# Patient Record
Sex: Female | Born: 1970 | Race: Black or African American | Hispanic: No | Marital: Single | Smoking: Never smoker
Health system: Southern US, Community
[De-identification: ages and names within clinical notes are randomized; demographics above are authoritative.]

## PROBLEM LIST (undated history)

## (undated) ENCOUNTER — Emergency Department (HOSPITAL_COMMUNITY): Admission: EM | Payer: No Typology Code available for payment source | Source: Home / Self Care

## (undated) DIAGNOSIS — F32A Depression, unspecified: Secondary | ICD-10-CM

## (undated) DIAGNOSIS — E785 Hyperlipidemia, unspecified: Secondary | ICD-10-CM

## (undated) DIAGNOSIS — B009 Herpesviral infection, unspecified: Secondary | ICD-10-CM

## (undated) DIAGNOSIS — K429 Umbilical hernia without obstruction or gangrene: Secondary | ICD-10-CM

## (undated) DIAGNOSIS — I8393 Asymptomatic varicose veins of bilateral lower extremities: Secondary | ICD-10-CM

## (undated) DIAGNOSIS — F329 Major depressive disorder, single episode, unspecified: Secondary | ICD-10-CM

## (undated) HISTORY — DX: Asymptomatic varicose veins of bilateral lower extremities: I83.93

## (undated) HISTORY — PX: TUBAL LIGATION: SHX77

## (undated) HISTORY — DX: Major depressive disorder, single episode, unspecified: F32.9

## (undated) HISTORY — DX: Hyperlipidemia, unspecified: E78.5

## (undated) HISTORY — DX: Depression, unspecified: F32.A

---

## 1998-07-04 ENCOUNTER — Other Ambulatory Visit: Admission: RE | Admit: 1998-07-04 | Discharge: 1998-07-04 | Payer: Self-pay | Admitting: Obstetrics

## 1998-07-05 ENCOUNTER — Ambulatory Visit (HOSPITAL_COMMUNITY): Admission: RE | Admit: 1998-07-05 | Discharge: 1998-07-05 | Payer: Self-pay | Admitting: Obstetrics

## 1998-07-08 ENCOUNTER — Encounter: Payer: Self-pay | Admitting: Obstetrics

## 1998-07-08 ENCOUNTER — Ambulatory Visit (HOSPITAL_COMMUNITY): Admission: RE | Admit: 1998-07-08 | Discharge: 1998-07-08 | Payer: Self-pay | Admitting: Obstetrics

## 1998-07-17 ENCOUNTER — Inpatient Hospital Stay (HOSPITAL_COMMUNITY): Admission: RE | Admit: 1998-07-17 | Discharge: 1998-07-17 | Payer: Self-pay | Admitting: Obstetrics

## 1998-07-17 ENCOUNTER — Encounter: Payer: Self-pay | Admitting: Obstetrics

## 1999-11-25 ENCOUNTER — Other Ambulatory Visit: Admission: RE | Admit: 1999-11-25 | Discharge: 1999-11-25 | Payer: Self-pay | Admitting: Family Medicine

## 2000-08-02 ENCOUNTER — Emergency Department (HOSPITAL_COMMUNITY): Admission: EM | Admit: 2000-08-02 | Discharge: 2000-08-02 | Payer: Self-pay | Admitting: Emergency Medicine

## 2000-08-02 ENCOUNTER — Encounter: Payer: Self-pay | Admitting: Emergency Medicine

## 2000-08-03 ENCOUNTER — Emergency Department (HOSPITAL_COMMUNITY): Admission: EM | Admit: 2000-08-03 | Discharge: 2000-08-03 | Payer: Self-pay | Admitting: Emergency Medicine

## 2003-01-02 ENCOUNTER — Emergency Department (HOSPITAL_COMMUNITY): Admission: EM | Admit: 2003-01-02 | Discharge: 2003-01-02 | Payer: Self-pay | Admitting: Emergency Medicine

## 2003-02-03 ENCOUNTER — Emergency Department (HOSPITAL_COMMUNITY): Admission: EM | Admit: 2003-02-03 | Discharge: 2003-02-03 | Payer: Self-pay | Admitting: Emergency Medicine

## 2003-07-11 ENCOUNTER — Emergency Department (HOSPITAL_COMMUNITY): Admission: EM | Admit: 2003-07-11 | Discharge: 2003-07-11 | Payer: Self-pay | Admitting: Emergency Medicine

## 2003-12-18 ENCOUNTER — Ambulatory Visit (HOSPITAL_COMMUNITY): Admission: RE | Admit: 2003-12-18 | Discharge: 2003-12-18 | Payer: Self-pay | Admitting: Obstetrics

## 2004-09-05 ENCOUNTER — Inpatient Hospital Stay (HOSPITAL_COMMUNITY): Admission: AD | Admit: 2004-09-05 | Discharge: 2004-09-05 | Payer: Self-pay | Admitting: Family Medicine

## 2004-12-10 ENCOUNTER — Inpatient Hospital Stay (HOSPITAL_COMMUNITY): Admission: AD | Admit: 2004-12-10 | Discharge: 2004-12-10 | Payer: Self-pay | Admitting: Obstetrics

## 2005-10-31 ENCOUNTER — Emergency Department (HOSPITAL_COMMUNITY): Admission: AD | Admit: 2005-10-31 | Discharge: 2005-10-31 | Payer: Self-pay | Admitting: Family Medicine

## 2006-11-10 ENCOUNTER — Emergency Department (HOSPITAL_COMMUNITY): Admission: EM | Admit: 2006-11-10 | Discharge: 2006-11-10 | Payer: Self-pay | Admitting: Emergency Medicine

## 2006-12-24 ENCOUNTER — Emergency Department (HOSPITAL_COMMUNITY): Admission: EM | Admit: 2006-12-24 | Discharge: 2006-12-24 | Payer: Self-pay | Admitting: Emergency Medicine

## 2007-12-05 ENCOUNTER — Ambulatory Visit: Payer: Self-pay | Admitting: Nurse Practitioner

## 2007-12-05 DIAGNOSIS — E669 Obesity, unspecified: Secondary | ICD-10-CM | POA: Insufficient documentation

## 2007-12-05 DIAGNOSIS — K429 Umbilical hernia without obstruction or gangrene: Secondary | ICD-10-CM | POA: Insufficient documentation

## 2007-12-05 DIAGNOSIS — B351 Tinea unguium: Secondary | ICD-10-CM | POA: Insufficient documentation

## 2007-12-28 ENCOUNTER — Ambulatory Visit: Payer: Self-pay | Admitting: Nurse Practitioner

## 2007-12-28 DIAGNOSIS — A5901 Trichomonal vulvovaginitis: Secondary | ICD-10-CM

## 2007-12-28 LAB — CONVERTED CEMR LAB
ALT: 12 units/L (ref 0–35)
AST: 13 units/L (ref 0–37)
Albumin: 3.9 g/dL (ref 3.5–5.2)
Alkaline Phosphatase: 50 units/L (ref 39–117)
BUN: 11 mg/dL (ref 6–23)
Bilirubin Urine: NEGATIVE
Blood in Urine, dipstick: NEGATIVE
CO2: 24 meq/L (ref 19–32)
Eosinophils Absolute: 0.3 10*3/uL (ref 0.0–0.7)
Eosinophils Relative: 5 % (ref 0–5)
Glucose, Bld: 98 mg/dL (ref 70–99)
Glucose, Urine, Semiquant: NEGATIVE
HDL: 40 mg/dL (ref >39)
Hemoglobin: 12.2 g/dL (ref 12.0–15.0)
LDL Cholesterol: 132 mg/dL — ABNORMAL HIGH (ref 0–99)
Lymphocytes Relative: 52 % — ABNORMAL HIGH (ref 12–46)
Lymphs Abs: 2.8 10*3/uL (ref 0.7–4.0)
Monocytes Absolute: 0.5 10*3/uL (ref 0.1–1.0)
Monocytes Relative: 8 % (ref 3–12)
Neutro Abs: 1.9 10*3/uL (ref 1.7–7.7)
Nitrite: NEGATIVE
Platelets: 260 10*3/uL (ref 150–400)
RDW: 14.6 % (ref 11.5–15.5)
Sodium: 141 meq/L (ref 135–145)
Specific Gravity, Urine: >=1.03
TSH: 0.897 microintl units/mL (ref 0.350–4.50)
Total Bilirubin: 0.6 mg/dL (ref 0.3–1.2)
Total Protein: 7.5 g/dL (ref 6.0–8.3)
Urobilinogen, UA: 0.2
pH: 5

## 2007-12-30 ENCOUNTER — Encounter (INDEPENDENT_AMBULATORY_CARE_PROVIDER_SITE_OTHER): Payer: Self-pay | Admitting: Nurse Practitioner

## 2007-12-30 LAB — CONVERTED CEMR LAB: Pap Smear: NEGATIVE

## 2008-01-03 ENCOUNTER — Ambulatory Visit (HOSPITAL_COMMUNITY): Admission: RE | Admit: 2008-01-03 | Discharge: 2008-01-03 | Payer: Self-pay | Admitting: Family Medicine

## 2008-01-12 ENCOUNTER — Emergency Department (HOSPITAL_COMMUNITY): Admission: EM | Admit: 2008-01-12 | Discharge: 2008-01-12 | Payer: Self-pay | Admitting: Emergency Medicine

## 2008-01-13 ENCOUNTER — Telehealth (INDEPENDENT_AMBULATORY_CARE_PROVIDER_SITE_OTHER): Payer: Self-pay | Admitting: Nurse Practitioner

## 2008-12-26 ENCOUNTER — Emergency Department (HOSPITAL_COMMUNITY): Admission: EM | Admit: 2008-12-26 | Discharge: 2008-12-27 | Payer: Self-pay | Admitting: Emergency Medicine

## 2009-04-01 ENCOUNTER — Emergency Department (HOSPITAL_COMMUNITY): Admission: EM | Admit: 2009-04-01 | Discharge: 2009-04-01 | Payer: Self-pay | Admitting: Emergency Medicine

## 2009-06-06 ENCOUNTER — Ambulatory Visit: Payer: Self-pay | Admitting: Nurse Practitioner

## 2009-06-06 DIAGNOSIS — M79609 Pain in unspecified limb: Secondary | ICD-10-CM | POA: Insufficient documentation

## 2009-06-06 LAB — CONVERTED CEMR LAB
BUN: 10 mg/dL (ref 6–23)
Basophils Relative: 1 % (ref 0–1)
CO2: 26 meq/L (ref 19–32)
Cholesterol: 209 mg/dL — ABNORMAL HIGH (ref 0–200)
Creatinine, Ser: 0.58 mg/dL (ref 0.40–1.20)
Eosinophils Absolute: 0.2 10*3/uL (ref 0.0–0.7)
Glucose, Bld: 94 mg/dL (ref 70–99)
Hemoglobin: 12.4 g/dL (ref 12.0–15.0)
LDL Cholesterol: 142 mg/dL — ABNORMAL HIGH (ref 0–99)
Lymphocytes Relative: 47 % — ABNORMAL HIGH (ref 12–46)
MCHC: 31.7 g/dL (ref 30.0–36.0)
Monocytes Absolute: 0.4 10*3/uL (ref 0.1–1.0)
Monocytes Relative: 8 % (ref 3–12)
Neutro Abs: 2.2 10*3/uL (ref 1.7–7.7)
Neutrophils Relative %: 41 % — ABNORMAL LOW (ref 43–77)
Potassium: 4.6 meq/L (ref 3.5–5.3)
RBC: 4.35 M/uL (ref 3.87–5.11)
Sodium: 140 meq/L (ref 135–145)
TSH: 0.88 microintl units/mL (ref 0.350–4.500)
Total Bilirubin: 0.4 mg/dL (ref 0.3–1.2)
Total CHOL/HDL Ratio: 4.2
Total Protein: 7.4 g/dL (ref 6.0–8.3)

## 2009-06-07 ENCOUNTER — Encounter (INDEPENDENT_AMBULATORY_CARE_PROVIDER_SITE_OTHER): Payer: Self-pay | Admitting: Nurse Practitioner

## 2009-06-19 ENCOUNTER — Ambulatory Visit: Payer: Self-pay | Admitting: Nurse Practitioner

## 2009-06-19 DIAGNOSIS — E7800 Pure hypercholesterolemia, unspecified: Secondary | ICD-10-CM | POA: Insufficient documentation

## 2009-06-19 DIAGNOSIS — I8390 Asymptomatic varicose veins of unspecified lower extremity: Secondary | ICD-10-CM

## 2009-06-19 DIAGNOSIS — R209 Unspecified disturbances of skin sensation: Secondary | ICD-10-CM | POA: Insufficient documentation

## 2009-06-19 LAB — CONVERTED CEMR LAB
Bilirubin Urine: NEGATIVE
Blood in Urine, dipstick: NEGATIVE
Cholesterol, target level: 200 mg/dL
Glucose, Urine, Semiquant: NEGATIVE
HDL goal, serum: 40 mg/dL
Ketones, urine, test strip: NEGATIVE
LDL Goal: 160 mg/dL
Protein, U semiquant: NEGATIVE
Urobilinogen, UA: 0.2
pH: 5.5

## 2009-06-20 DIAGNOSIS — B009 Herpesviral infection, unspecified: Secondary | ICD-10-CM | POA: Insufficient documentation

## 2009-06-20 LAB — CONVERTED CEMR LAB: Chlamydia, DNA Probe: NEGATIVE

## 2009-06-26 ENCOUNTER — Encounter (INDEPENDENT_AMBULATORY_CARE_PROVIDER_SITE_OTHER): Payer: Self-pay | Admitting: Nurse Practitioner

## 2009-06-26 LAB — CONVERTED CEMR LAB: Pap Smear: NEGATIVE

## 2009-07-05 ENCOUNTER — Telehealth (INDEPENDENT_AMBULATORY_CARE_PROVIDER_SITE_OTHER): Payer: Self-pay | Admitting: Nurse Practitioner

## 2010-01-14 ENCOUNTER — Ambulatory Visit: Payer: Self-pay | Admitting: Nurse Practitioner

## 2010-01-14 DIAGNOSIS — G8929 Other chronic pain: Secondary | ICD-10-CM | POA: Insufficient documentation

## 2010-01-14 DIAGNOSIS — F329 Major depressive disorder, single episode, unspecified: Secondary | ICD-10-CM | POA: Insufficient documentation

## 2010-01-14 DIAGNOSIS — M549 Dorsalgia, unspecified: Secondary | ICD-10-CM | POA: Insufficient documentation

## 2010-03-03 ENCOUNTER — Ambulatory Visit: Payer: Self-pay | Admitting: Nurse Practitioner

## 2010-04-11 ENCOUNTER — Ambulatory Visit (HOSPITAL_COMMUNITY)
Admission: RE | Admit: 2010-04-11 | Discharge: 2010-04-11 | Payer: Self-pay | Source: Home / Self Care | Admitting: Internal Medicine

## 2010-05-15 ENCOUNTER — Telehealth (INDEPENDENT_AMBULATORY_CARE_PROVIDER_SITE_OTHER): Payer: Self-pay | Admitting: Nurse Practitioner

## 2010-06-15 ENCOUNTER — Encounter: Payer: Self-pay | Admitting: Internal Medicine

## 2010-06-24 NOTE — Letter (Signed)
Summary: Handout Printed  Printed Handout:  - Varicose Veins 

## 2010-06-24 NOTE — Progress Notes (Signed)
Summary: DEPRESSION SCREENING  DEPRESSION SCREENING   Imported By: Arta Bruce 07/15/2009 15:59:52  _____________________________________________________________________  External Attachment:    Type:   Image     Comment:   External Document

## 2010-06-24 NOTE — Letter (Signed)
Summary: *HSN Results Follow up  [facility]  [address] [place], [place] [address]   Phone: [phone]  Fax: [phone]      06/26/2009   [name] [address] [place], [place]  [address]   Dear  Ms. Mary Heath,                            ____S.Drinkard,FNP   ____D. Gore,FNP       ____B. McPherson,MD   ____V. Rankins,MD    ____E. Mulberry,MD    _X___[name], FNP  ____D. Reche Dixon, MD    ____K. Philipp Deputy, MD    ____Other     This letter is to inform you that your recent test(s):  ___X____Pap Smear    _______Lab Test     _______X-ray    ___X____ is within acceptable limits  _______ requires a medication change  _______ requires a follow-up lab visit  _______ requires a follow-up visit with your provider   Comments: Pap Smear results are normal.       _________________________________________________________ If you have any questions, please contact our office [phone].                    Sincerely,    [name] FNP [facility]

## 2010-06-24 NOTE — Progress Notes (Signed)
Summary: Office Visit//DEPRESSION SCREENING  Office Visit//DEPRESSION SCREENING   Imported By: Arta Bruce 01/15/2010 12:07:01  _____________________________________________________________________  External Attachment:    Type:   Image     Comment:   External Document

## 2010-06-24 NOTE — Assessment & Plan Note (Signed)
Summary: Acute - Leg Pain   Vital Signs:  Patient profile:   40 year old female LMP:     12/10 Height:      64 inches Weight:      257.7 pounds BMI:     44.39 BSA:     2.18 Temp:     97.6 degrees F oral Pulse rate:   88 / minute Pulse rhythm:   regular Resp:     20 per minute BP sitting:   120 / 76  (left arm) Cuff size:   large  Vitals Entered By: Levon Hedger (June 06, 2009 9:46 AM) CC: pain in left leg with some numbness....pt is concerned about hernia on stomach Is Patient Diabetic? No Pain Assessment Patient in pain? no       Does patient need assistance? Functional Status Self care Ambulation Normal LMP (date): 12/10 LMP - Character: light     Enter LMP: 12/10 Last PAP Result negative   CC:  pain in left leg with some numbness....pt is concerned about hernia on stomach.  History of Present Illness:  Pt into the office after no visit in 1 year with c/o left leg pain started intermittently over 1 year ago. notices when she wakes after sleeping on that side - present this morning when she woke but admits that she slept on the couch last night. Employed at [employer] working 8-10 hours per day and stands for the majority of her shift. - Tobacco abuse  Umbilical Hernia  - noted on last visit.  No problems with hernia but just wants it checked. Obesity - lost 20 since her last visit.  Habits & Providers  Alcohol-Tobacco-Diet     Alcohol drinks/day: 0     Tobacco Status: never  Exercise-Depression-Behavior     Does Patient Exercise: no     Exercise Counseling: to improve exercise regimen     Have you felt down or hopeless? no     Have you felt little pleasure in things? no     Depression Counseling: not indicated; screening negative for depression     Drug Use: no     Seat Belt Use: 100     Sun Exposure: occasionally  Medications Prior to Update: 1)  None  Allergies (verified): No Known Drug Allergies  Social History: Does Patient Exercise:   no  Review of Systems CV:  Denies chest pain or discomfort. Resp:  Denies cough. GI:  Denies abdominal pain, nausea, and vomiting. MS:  left leg pain. Neuro:  Complains of tingling; left leg. Endo:  Denies polyuria.  Physical Exam  General:  alert.  obese Head:  normocephalic.   Lungs:  normal breath sounds.   Heart:  normal rate and regular rhythm.   Abdomen:  small umbilical hernia -reducible Msk:  up to the exam table active ROM in all extremities - no deficit to left lower Neurologic:  alert & oriented X3.   Skin:  color normal.   Psych:  Oriented X3.     Impression & Recommendations:  Problem # 1:  LEG PAIN, LEFT (ICD-729.5) most likely positional  advised pt not to lie in one position at night to avoid tingling the next morning  Problem # 2:  NEED PROPHYLACTIC VACCINATION&INOCULATION FLU (ICD-V04.81) given today  Problem # 3:  UMBILICAL HERNIA (ICD-553.1) not problematic at this time. advised pt that continued weight loss is key avoid straining  Problem # 4:  OBESITY (ICD-278.00) need to increase exercise and decrease calories weight managment  handout given  Other Orders: Flu Vaccine 75yrs + [phone]) Admin 1st Vaccine ([address]) Admin 1st Vaccine [facility]) [phone]) T-Lipid Profile [phone]) T-Comprehensive Metabolic Panel [phone]) T-CBC w/Diff [phone]) T-HIV Antibody  ([facility]) [phone]) T-TSH [phone])  Patient Instructions: 1)  Your labs will be checked today and you will be notified of the results. 2)  Leg pain - likely positional from sleeping on the left side.  You will be checked for diabetes today. 3)  Continue your efforts at weight loss.  This is very helpful for your hernia. 4)  Schedule an appointment for a complete physical exam. 5)  You will need PAP, mammogra, PHQ-9., u/a.   Influenza Vaccine    Vaccine Type: Fluvax 3+    Site: right deltoid    Mfr: Sanofi Pasteur    Dose: 0.5 ml    Route: IM    Given by: Levon Hedger    Exp. Date: 11/21/2009    Lot #: [id number]    VIS given: 12/16/06 version given June 06, 2009.  Flu Vaccine Consent Questions    Do you have a history of severe allergic reactions to this vaccine? no    Any prior history of allergic reactions to egg and/or gelatin? no    Do you have a sensitivity to the preservative Thimersol? no    Do you have a past history of Guillan-Barre Syndrome? no    Do you currently have an acute febrile illness? no    Have you ever had a severe reaction to latex? no    Vaccine information given and explained to patient? yes    Are you currently pregnant? no

## 2010-06-24 NOTE — Progress Notes (Signed)
Summary: blister in mouth/schedule appt  Phone Note Call from Patient   Summary of Call: pt is calling because she has a blister in her mouth that has been there for 2 days and wants to know if she can get Rx for this because she says she does not want it to break out on her face.  [facility] [address] Initial call taken by: Levon Hedger,  July 05, 2009 3:12 PM  Follow-up for Phone Call        Unfortunately i will need to see the blister to determine what to prescribe.   Follow-up by: [name] FNP,  July 07, 2009 10:46 PM  Additional Follow-up for Phone Call Additional follow up Details #1::        schedule pt an appt...Marland KitchenMarland KitchenMikey College CMA  July 10, 2009 4:35 PM     Additional Follow-up for Phone Call Additional follow up Details #2::    CALLED PATIENT ON 02/17 AND THE PHONE WAS BUSY ALL AFTERNOON.  I CALLED AGAIN ON THIS MORNING 02/18 AND THE LINE IS STILL BUSY. Follow-up by: Leodis Rains,  July 12, 2009 8:30 AM  Additional Follow-up for Phone Call Additional follow up Details #3:: Details for Additional Follow-up Action Taken: ATTEMPTED TO CALL HOME AGAIN AND THE NUMBER IS STILL BUSY. GOING TO SIGN OFF AND IF PATIENT CALLS, WE NEED TO SEE IF SHE STILL NEEDS AN APPOINTMENT. Additional Follow-up by: Leodis Rains,  July 12, 2009 1:18 PM

## 2010-06-24 NOTE — Assessment & Plan Note (Signed)
Summary: Complete Physical  Exam   Vital Signs:  Patient profile:   40 year old female Menstrual status:  regular LMP:     06/09/2009 Weight:      258.2 pounds BMI:     44.48 BSA:     2.18 Temp:     97.3 degrees F oral Pulse rate:   80 / minute Pulse rhythm:   regular Resp:     20 per minute BP sitting:   123 / 85  (left arm) Cuff size:   large  Vitals Entered By: Levon Hedger (June 19, 2009 10:35 AM) CC: Lipid Management Is Patient Diabetic? No Pain Assessment Patient in pain? no       Does patient need assistance? Functional Status Self care Ambulation Normal LMP (date): 06/09/2009 LMP - Character: light     Menstrual Status regular Enter LMP: 06/09/2009 Last PAP Result negative   CC:  Lipid Management.  History of Present Illness:  Pt into the office for a complete physical   Pap - last done 1year ago.  All previous normal. Menses - still every month.  S/p tubal ligation.   5 children, 4 living (1 deceased 1 year ago) No currently married  Mammogram - last mammogram over 1 year ago mother deceased with breast cancer at age 55  Optho - lost her glasses but she lost the glasses. Reading glasses.  Eyes checked by Dr. Mitzi Davenport  Dental - No recent dental exam.   Tdap - up to date, done last in 2009  Lipid Management History:      Negative NCEP/ATP III risk factors include female age less than 72 years old, non-diabetic, no family history for ischemic heart disease, non-tobacco-user status, non-hypertensive, no ASHD (atherosclerotic heart disease), no prior stroke/TIA, no peripheral vascular disease, and no history of aortic aneurysm.        Comments include: No current meds.  Comments: Elevation noted on last visit.  no current meds.Pt is employed at [employer] so finds it hard to modify her diet.   Habits & Providers  Alcohol-Tobacco-Diet     Alcohol drinks/day: 0     Tobacco Status: never  Exercise-Depression-Behavior     Does Patient  Exercise: no     Exercise Counseling: to improve exercise regimen     Have you felt down or hopeless? no     Have you felt little pleasure in things? no     Depression Counseling: not indicated; screening negative for depression     STD Risk: past     Drug Use: no     Seat Belt Use: 100     Sun Exposure: occasionally  Comments: PHQ-9 score = 12  Medications Prior to Update: 1)  None  Allergies (verified): No Known Drug Allergies  Social History: STD Risk:  past  Review of Systems General:  Denies fever. Eyes:  Denies blurring. ENT:  Denies earache. CV:  Denies chest pain or discomfort. Resp:  Denies cough. GI:  Denies abdominal pain, nausea, and vomiting. GU:  Denies dysuria. MS:  Denies joint pain. Derm:  Denies rash; nose - pimple that appeared about 2 days ago.  getting smaller in size. Neuro:  Complains of tingling; denies headaches; sometimes lower lips, sometimes sees a "bump" that lasts for about 2 days. Psych:  Denies anxiety and depression. Endo:  Denies excessive urination.  Physical Exam  General:  alert.  obese Head:  normocephalic.   Eyes:  vision grossly intact, pupils equal, and pupils round.  Ears:  Bil TM with bony landmarks present right with minimal cerumen Nose:  no nasal discharge.   Mouth:  fair dentition.  some missing teeth Neck:  supple.   Chest Wall:  no mass.   Breasts:  pendulous nipples everted, no masses or lumps noted  Lungs:  normal breath sounds.   Heart:  normal rate and regular rhythm.   Abdomen:  soft, non-tender, and normal bowel sounds.   Rectal:  no external abnormalities.  guaiac negative Msk:  up to the exam table varicosities to bil lower extremities Extremities:  no edema Neurologic:  alert & oriented X3.   Skin:  nose with papule - slightly raised and red Psych:  Oriented X3.    Pelvic Exam  Vulva:      normal appearance.   Urethra and Bladder:      Urethra--normal.   Vagina:      physiologic discharge.     Cervix:      midposition.   Adnexa:      nontender bilaterally.   Rectum:      normal, heme negative stool.      Impression & Recommendations:  Problem # 1:  ROUTINE GYNECOLOGICAL EXAMINATION (ICD-V72.31) Pap Done  labs reviewed from previous visit rec optho and dental exam  PHQ-9 score = 12 guaiac negative Orders: UA Dipstick w/o Micro (manual) (66063) T- GC Chlamydia (01601) KOH/ WET Mount [phone]) Pap Smear, Thin Prep ( Collection of) (F5732) Hemoccult Guaiac-1 spec.(in office) (82270)  Problem # 2:  UNSPECIFIED BREAST SCREENING (ICD-V76.10) mother deceased of breast cancer mammogram scheduled self breast exam reviewed Orders: Mammogram (Screening) (Mammo)  Problem # 3:  VARICOSE VEINS, LOWER EXTREMITIES (ICD-454.9) handout given pt stands for 8-10 hours per day at work will give TED hose for support  Problem # 4:  TINGLING (ICD-782.0) will check for herpes vitamin Orders: T-Herpes Simplex Type 1 (20254-27062) T-Herpes Simplex Type 2 (37628-31517)  Problem # 5:  OBESITY (ICD-278.00) advised pt that she does need to lose weight  Problem # 6:  HYPERCHOLESTEROLEMIA (ICD-272.0) dietary management  Lipid Assessment/Plan:      Based on NCEP/ATP III, the patient's risk factor category is "0-1 risk factors".  The patient's lipid goals are as follows: Total cholesterol goal is 200; LDL cholesterol goal is 160; HDL cholesterol goal is 40; Triglyceride goal is 150.    Patient Instructions: 1)  Remember to decrease the fried fatty foods in your diet.  This will help your cholesterol. 2)  You have varicose veins in your legs.  Wear support hose to work to keep them from getting worse.  Read handout 3)  nose - put warm washcloth to nose twice today.  apply ointment twice daily.  Do not use thick lotions to your face as this clogs the pores 4)  You will be informed of any abnormal labs. 5)  Keep your mammogram appointment 6)  Follow up as needed   Laboratory Results    Urine Tests  Date/Time Received: June 19, 2009 10:51 AM  Date/Time Reported: June 19, 2009 10:51 AM   Routine Urinalysis   Color: lt. yellow Appearance: Clear Glucose: negative   (Normal Range: Negative) Bilirubin: negative   (Normal Range: Negative) Ketone: negative   (Normal Range: Negative) Spec. Gravity: 1.025   (Normal Range: 1.003-1.035) Blood: negative   (Normal Range: Negative) pH: 5.5   (Normal Range: 5.0-8.0) Protein: negative   (Normal Range: Negative) Urobilinogen: 0.2   (Normal Range: 0-1) Nitrite: negative   (Normal Range:  Negative) Leukocyte Esterace: negative   (Normal Range: Negative)    Date/Time Received: June 19, 2009 11:37 AM   Wet Mount/KOH Source: vaginal WBC/hpf: 1-5 Bacteria/hpf: rare Clue cells/hpf: none Yeast/hpf: none Trichomonas/hpf: none  Stool - Occult Blood Hemmoccult #1: negative Date: 06/19/2009    Laboratory Results   Urine Tests    Routine Urinalysis   Color: lt. yellow Appearance: Clear Glucose: negative   (Normal Range: Negative) Bilirubin: negative   (Normal Range: Negative) Ketone: negative   (Normal Range: Negative) Spec. Gravity: 1.025   (Normal Range: 1.003-1.035) Blood: negative   (Normal Range: Negative) pH: 5.5   (Normal Range: 5.0-8.0) Protein: negative   (Normal Range: Negative) Urobilinogen: 0.2   (Normal Range: 0-1) Nitrite: negative   (Normal Range: Negative) Leukocyte Esterace: negative   (Normal Range: Negative)      Wet Mount Wet Mount KOH: Negative

## 2010-06-24 NOTE — Assessment & Plan Note (Signed)
Summary: Right hip/Mood   Vital Signs:  Patient profile:   40 year old female Menstrual status:  regular LMP:     03/03/2010 Weight:      253.1 pounds BMI:     43.60 Temp:     97.0 degrees F oral Pulse rate:   72 / minute Pulse rhythm:   regular Resp:     16 per minute BP sitting:   120 / 70  (left arm) Cuff size:   large  Vitals Entered By: Levon Hedger (March 03, 2010 10:46 AM)  Nutrition Counseling: Patient's BMI is greater than 25 and therefore counseled on weight management options. CC: leg pain for a long time...stress related issues needs something stronger, Back Pain, Lipid Management, Depression Is Patient Diabetic? No Pain Assessment Patient in pain? yes     Location: legs  Does patient need assistance? Functional Status Self care Ambulation Normal Comments when ran out of medication for lexapro did not get another refill LMP (date): 03/03/2010 LMP - Character: light     Enter LMP: 03/03/2010 Last PAP Result  Specimen Adequacy: Satisfactory for evaluation.   Interpretation/Result:Negative for intraepithelial Lesion or Malignancy.      CC:  leg pain for a long time...stress related issues needs something stronger, Back Pain, Lipid Management, and Depression.  History of Present Illness: Pt into the office with c/o right leg Pt reports that pain has been continuous. She lays on the right side, even today in the office and reports that it is in pain.  Left is not affected as much. Pain gets worse as the day progresses.  Sitting for 20-30 minutes throughout the day makes it difficult to get up and walk.  Social - Pt is still employed   Back Pain History:      The patient's back pain has been present for > 6 weeks.  The pain is located in the lower back region and does radiate below the knees.  She states this is not work related.  She states that she has had a prior history of back pain.  The patient has not had any recent physical therapy for her back  pain.  The following makes the back pain better: rest.  The following makes the back pain worse: working.    Critical Exclusionary Diagnosis Criteria (CEDC) for Back Pain:      The patient denies a history of previous trauma.  She has no prior history of spinal surgery.  There are no symptoms to suggest infection, cancer, cauda equina, or psychosocial factors for back pain.  Other positive CEDC factors include low back pain worse with activity.    Depression History:      The patient denies recurrent thoughts of death or suicide.  The patient denies symptoms of a manic disorder including abnormally & persistently irritable mood.        Psychosocial stress factors include the recent death of a loved one.  The patient denies that she feels like life is not worth living, denies that she wishes that she were dead, and denies that she has thought about ending her life.        Comments:  Pt is still dealing with the death of her son.  Today is the 2nd anniversary of his funeral.  Depression Treatment History:  Prior Medication Used:   Start Date: Assessment of Effect:   Comments:  lexapro     01/14/2010   some improvement     symptoms improved but not able  to afford Celexa (citalopram)     03/03/2010     --       started  Lipid Management History:      Negative NCEP/ATP III risk factors include female age less than 65 years old, non-diabetic, no family history for ischemic heart disease, non-tobacco-user status, non-hypertensive, no ASHD (atherosclerotic heart disease), no prior stroke/TIA, no peripheral vascular disease, and no history of aortic aneurysm.       Habits & Providers  Alcohol-Tobacco-Diet     Alcohol drinks/day: 0     Tobacco Status: never  Exercise-Depression-Behavior     Does Patient Exercise: no     Exercise Counseling: to improve exercise regimen     Depression Counseling: not indicated; screening negative for depression     STD Risk: past     Drug Use: no     Seat Belt  Use: 100     Sun Exposure: occasionally  Allergies (verified): No Known Drug Allergies  Review of Systems General:  Denies fever. CV:  Denies chest pain or discomfort. Resp:  Denies cough. GI:  Denies abdominal pain, nausea, and vomiting. MS:  Complains of joint pain; right hip. Psych:  Complains of depression.  Physical Exam  General:  alert.   Head:  normocephalic.   Lungs:  normal breath sounds.   Heart:  normal rate and regular rhythm.   Abdomen:  normal bowel sounds.   Msk:  up to the exam table Neurologic:  alert & oriented X3.   Psych:  Oriented X3.     Impression & Recommendations:  Problem # 1:  BACK PAIN (ICD-724.5) and right hip pain  advised pt to avoid laying on right side Her updated medication list for this problem includes:    Naproxen 500 Mg Tabs (Naproxen) ..... One tablet by mouth two times a day as needed for pain  Problem # 2:  DEPRESSION (ICD-311) will restart on meds Her updated medication list for this problem includes:    Citalopram Hydrobromide 20 Mg Tabs (Citalopram hydrobromide) ..... One tablet by mouth daily for mood  Problem # 3:  OBESITY (ICD-278.00) advised pt to increase exercise  Problem # 4:  NEED PROPHYLACTIC VACCINATION&INOCULATION FLU (ICD-V04.81) given today  Complete Medication List: 1)  Naproxen 500 Mg Tabs (Naproxen) .... One tablet by mouth two times a day as needed for pain 2)  Citalopram Hydrobromide 20 Mg Tabs (Citalopram hydrobromide) .... One tablet by mouth daily for mood 3)  Gabapentin 300 Mg Caps (Gabapentin) .... One capsule by mouth nightly for right hip pain  Other Orders: Flu Vaccine 21yrs + ([address]) Admin 1st Vaccine ([address]) Admin 1st Vaccine (State) [phone])  Lipid Assessment/Plan:      Based on NCEP/ATP III, the patient's risk factor category is "0-1 risk factors".  The patient's lipid goals are as follows: Total cholesterol goal is 200; LDL cholesterol goal is 160; HDL cholesterol goal is 40;  Triglyceride goal is 150.    Patient Instructions: 1)  Right leg - Try to lay on left side instead of the right. 2)  May take Naprosyn 500mg  by mouth two times a day as needed for pain during the day.  Take with food. 3)  At night take gabapentin 300mg  by mouth.  This may make you sleepy. 4)  You have received the flu vaccine today. 5)  Depression - Restart Citalopram 20mg   6)  When you get ready for mammogram we will schedule for you 7)  Follow up in 1 month for  depression and leg pain. Prescriptions: GABAPENTIN 300 MG CAPS (GABAPENTIN) One capsule by mouth nightly for right hip pain  #30 x 1   Entered and Authorized by:   [name] FNP   Signed by:   [name] FNP on 03/03/2010   Method used:   Print then Give to Patient   RxID:   [id number] NAPROXEN 500 MG TABS (NAPROXEN) One tablet by mouth two times a day as needed for pain  #50 x 0   Entered and Authorized by:   [name] FNP   Signed by:   [name] FNP on 03/03/2010   Method used:   Print then Give to Patient   RxID:   [id number] CITALOPRAM HYDROBROMIDE 20 MG TABS (CITALOPRAM HYDROBROMIDE) One tablet by mouth daily for mood  #30 x 1   Entered and Authorized by:   [name] FNP   Signed by:   [name] FNP on 03/03/2010   Method used:   Print then Give to Patient   RxID:   [id number]   Prevention & Chronic Care Immunizations   Influenza vaccine: Fluvax 3+  (03/03/2010)    Tetanus booster: 12/28/2007: Tdap    Pneumococcal vaccine: Not documented  Other Screening   Pap smear:  Specimen Adequacy: Satisfactory for evaluation.   Interpretation/Result:Negative for intraepithelial Lesion or Malignancy.     (06/19/2009)   Pap smear due: 06/2010    Mammogram: negative  (01/03/2008)   Smoking status: never  (03/03/2010)  Lipids   Total Cholesterol: 209  (06/06/2009)   LDL: 142  (06/06/2009)   LDL Direct: Not documented   HDL: 50  (06/06/2009)   Triglycerides: 85   (06/06/2009)    SGOT (AST): 13  (06/06/2009)   SGPT (ALT): 11  (06/06/2009)   Alkaline phosphatase: 58  (06/06/2009)   Total bilirubin: 0.4  (06/06/2009)  Self-Management Support :    Lipid self-management support: Not documented    Nursing Instructions: Give Flu vaccine today     Influenza Vaccine    Vaccine Type: Fluvax 3+    Site: right deltoid    Mfr: GlaxoSmithKline    Dose: 0.5 ml    Route: IM    Given by: Levon Hedger    Exp. Date: 10/2010    Lot #: [id number]    VIS given: 12/17/09 version given March 03, 2010.  Flu Vaccine Consent Questions    Do you have a history of severe allergic reactions to this vaccine? no    Any prior history of allergic reactions to egg and/or gelatin? no    Do you have a sensitivity to the preservative Thimersol? no    Do you have a past history of Guillan-Barre Syndrome? no    Do you currently have an acute febrile illness? no    Have you ever had a severe reaction to latex? no    Vaccine information given and explained to patient? yes    Are you currently pregnant? no    ndc  [phone]

## 2010-06-24 NOTE — Letter (Signed)
Summary: Lipid Letter  [facility]  [address] [place], [place] [address]   Phone: [phone]  Fax: [phone]    06/07/2009  Mary Heath [address] [place], [place]  [address]  Dear Mary Heath:  We have carefully reviewed your last lipid profile from 06/06/2009 and the results are noted below with a summary of recommendations for lipid management.    Cholesterol:      209    Goal: less than 200   HDL "good" Cholesterol:   50     Goal: greater than 40   LDL "bad" Cholesterol:  142    Goal: less than 100   Triglycerides:       85     Goal: less than 150    Labs done during your recent office visit were ok except your cholesterol is slightly elevated.  See above for exact values. No need for medications at this time. Just try to avoid fried fatty foods.      Current Medications:  None If you have any questions, please call. We appreciate being able to work with you.   Sincerely,    [facility] [name] FNP

## 2010-06-24 NOTE — Assessment & Plan Note (Signed)
Summary: Back Pain/Depression   Vital Signs:  Patient profile:   40 year old female Menstrual status:  regular LMP:     12/2009 Weight:      251.6 pounds BMI:     43.34 Temp:     97.5 degrees F oral Pulse rate:   83 / minute Pulse rhythm:   regular Resp:     20 per minute BP sitting:   128 / 86  (left arm) Cuff size:   large  Vitals Entered By: Levon Hedger (January 14, 2010 10:06 AM)  Nutrition Counseling: Patient's BMI is greater than 25 and therefore counseled on weight management options. CC: lower back pain x 1 week she states she had been moving something...pain  radiating down leg and leg has been hurting for about a month or 2 , Depression, Back Pain Is Patient Diabetic? No Pain Assessment Patient in pain? yes     Location: back, leg Intensity: 2  Does patient need assistance? Functional Status Self care Ambulation Normal LMP (date): 12/2009 LMP - Character: light     Enter LMP: 12/2009 Last PAP Result  Specimen Adequacy: Satisfactory for evaluation.   Interpretation/Result:Negative for intraepithelial Lesion or Malignancy.      CC:  lower back pain x 1 week she states she had been moving something...pain  radiating down leg and leg has been hurting for about a month or 2 , Depression, and Back Pain.  History of Present Illness:  Pt into the office with c/o back pain. Started about 2 weeks ago. ? 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  Back Pain History:      The patient's back pain has been present for < 6 weeks.  The pain is located in the lower back region and does not radiate below the knees.  She states that she has no prior history of back pain.  The patient has not had any recent physical therapy for her back pain.    Critical Exclusionary Diagnosis Criteria (CEDC) for Back Pain:      The patient denies a history of previous trauma.  She has no prior history of spinal surgery.  There are no symptoms to suggest  infection, cancer, cauda equina, or psychosocial factors for back pain.    Depression History:      The patient presents with symptoms of depression which have been present for greater than two weeks.  The patient is having a depressed mood most of the day and has a diminished interest in her usual daily activities.  Positive alarm features for depression include impaired concentration (indecisiveness).  However, she denies recurrent thoughts of death or suicide.        Psychosocial stress factors include the recent death of a loved one.  The patient denies that she feels like life is not worth living, denies that she wishes that she were dead, and denies that she has thought about ending her life.        Comments:  Pt is still grieving the loss of her son - anniversary to his death is coming up in 04/03/23.  Depression Treatment History:  Prior Medication Used:   Start Date: Assessment of Effect:   Comments:  lexapro     01/14/2010     --       started    Allergies (verified): No Known Drug Allergies  Review of Systems CV:  Denies chest pain or discomfort. Resp:  Denies cough.  GI:  Denies abdominal pain, nausea, and vomiting. MS:  Complains of low back pain. Psych:  Complains of depression.  Physical Exam  General:  alert.   Head:  normocephalic.   Lungs:  normal breath sounds.   Heart:  normal rate and regular rhythm.   Abdomen:  normal bowel sounds.   Msk:  up to the exam table Neurologic:  gait normal.   Skin:  color normal.   Psych:  Oriented X3.     Detailed Back/Spine Exam  Lumbosacral Exam:  Inspection-deformity:    Normal Palpation-spinal tenderness:  Abnormal    Location:  L4-L5 Lying Straight Leg Raise:    Right:  negative    Left:  negative   Impression & Recommendations:  Problem # 1:  BACK PAIN (ICD-724.5) ? muscle strain PE not impressive - no limits advised heat and anti-inflammatories Her updated medication list for this problem includes:     Naproxen 500 Mg Tabs (Naproxen) ..... One tablet by mouth two times a day as needed for pain  Problem # 2:  DEPRESSION (ICD-311) PHQ-9 score = 18 spoke with pt about the dx will start lexapro will discss referral to LCSW on next visit Her updated medication list for this problem includes:    Lexapro 10 Mg Tabs (Escitalopram oxalate) ..... One tablet by mouth nightly for mood  Complete Medication List: 1)  Naproxen 500 Mg Tabs (Naproxen) .... One tablet by mouth two times a day as needed for pain 2)  Lexapro 10 Mg Tabs (Escitalopram oxalate) .... One tablet by mouth nightly for mood  Patient Instructions: 1)  Back pain - most likely due to pulled muscle in the lower back. 2)  May apply heat to the affected area. 3)  Take naprosyn 500mg  by mouth two times a day for back pain 4)  Be sure you eat food before taking this medication 5)  Remember that your mammogram is past due.  This office can re-schedule for you when you are ready. 6)  Lexapro - start 10mg  by mouth daily for mood.   7)  Follow up in 3 weeks with n.martin,fnp for medication review - lexapro.  Will need referral to [facility]  Prescriptions: LEXAPRO 10 MG TABS (ESCITALOPRAM OXALATE) One tablet by mouth nightly for mood  #28 x 0   Entered and Authorized by:   [name] FNP   Signed by:   [name] FNP on 01/14/2010   Method used:   Print then Give to Patient   RxID:   [id number] NAPROXEN 500 MG TABS (NAPROXEN) One tablet by mouth two times a day as needed for pain  #50 x 0   Entered and Authorized by:   [name] FNP   Signed by:   [name] FNP on 01/14/2010   Method used:   Print then Give to Patient   RxID:   [id number]

## 2010-06-24 NOTE — Letter (Signed)
Summary: TEST ORDER FORM//MAMMOGRAM//APPT DATE &TIME  TEST ORDER FORM//MAMMOGRAM//APPT DATE &TIME   Imported By: Arta Bruce 08/06/2009 12:38:29  _____________________________________________________________________  External Attachment:    Type:   Image     Comment:   External Document

## 2010-06-26 NOTE — Progress Notes (Signed)
Summary: NEEDS SUPPORT HOSE  Phone Note Call from Patient Call back at Home Phone [phone]   Reason for Call: Talk to Nurse Summary of Call: MARTIN PT.  MS Fendley CALLING TO SEE IF WE ANY SUOPPORT HOSE HERE IN THE OFFICE. Initial call taken by: Leodis Rains,  May 15, 2010 3:04 PM  Follow-up for Phone Call        Advised that we have a pair available -- on her way here.  Dutch Quint RN  May 15, 2010 3:15 PM  Received pair of TED hose.  Dutch Quint RN  May 15, 2010 5:15 PM

## 2011-03-09 LAB — TSH: TSH: 0.47

## 2011-03-09 LAB — I-STAT 8, (EC8 V) (CONVERTED LAB)
BUN: 7
Bicarbonate: 26.1 — ABNORMAL HIGH
HCT: 39
Hemoglobin: 13.3
Operator id: 247071
Sodium: 141
TCO2: 27

## 2011-03-11 LAB — POCT URINALYSIS DIP (DEVICE)
Ketones, ur: NEGATIVE
Protein, ur: NEGATIVE
Urobilinogen, UA: 1
pH: 6.5

## 2011-06-18 ENCOUNTER — Other Ambulatory Visit (HOSPITAL_COMMUNITY): Payer: Self-pay | Admitting: Internal Medicine

## 2011-06-18 ENCOUNTER — Other Ambulatory Visit: Payer: Self-pay | Admitting: Family Medicine

## 2011-06-18 DIAGNOSIS — Z1231 Encounter for screening mammogram for malignant neoplasm of breast: Secondary | ICD-10-CM

## 2011-07-16 ENCOUNTER — Ambulatory Visit (HOSPITAL_COMMUNITY): Payer: Self-pay | Attending: Internal Medicine

## 2011-11-27 ENCOUNTER — Encounter (HOSPITAL_COMMUNITY): Payer: Self-pay | Admitting: *Deleted

## 2011-11-27 ENCOUNTER — Emergency Department (INDEPENDENT_AMBULATORY_CARE_PROVIDER_SITE_OTHER)
Admission: EM | Admit: 2011-11-27 | Discharge: 2011-11-27 | Disposition: A | Discharge status: Home / Self Care | Payer: Self-pay | Source: Home / Self Care | Attending: Emergency Medicine | Admitting: Emergency Medicine

## 2011-11-27 DIAGNOSIS — N889 Noninflammatory disorder of cervix uteri, unspecified: Secondary | ICD-10-CM

## 2011-11-27 DIAGNOSIS — B9689 Other specified bacterial agents as the cause of diseases classified elsewhere: Secondary | ICD-10-CM

## 2011-11-27 DIAGNOSIS — N760 Acute vaginitis: Secondary | ICD-10-CM

## 2011-11-27 DIAGNOSIS — R109 Unspecified abdominal pain: Secondary | ICD-10-CM

## 2011-11-27 DIAGNOSIS — A499 Bacterial infection, unspecified: Secondary | ICD-10-CM

## 2011-11-27 DIAGNOSIS — E876 Hypokalemia: Secondary | ICD-10-CM

## 2011-11-27 DIAGNOSIS — K429 Umbilical hernia without obstruction or gangrene: Secondary | ICD-10-CM

## 2011-11-27 DIAGNOSIS — R110 Nausea: Secondary | ICD-10-CM

## 2011-11-27 LAB — POCT URINALYSIS DIP (DEVICE)
Nitrite: NEGATIVE
Protein, ur: NEGATIVE mg/dL
Specific Gravity, Urine: >=1.03 (ref 1.005–1.030)
Urobilinogen, UA: 0.2 mg/dL (ref 0.0–1.0)

## 2011-11-27 LAB — WET PREP, GENITAL
Trich, Wet Prep: NONE SEEN
Yeast Wet Prep HPF POC: NONE SEEN

## 2011-11-27 LAB — POCT I-STAT, CHEM 8
Calcium, Ion: 1.29 mmol/L — ABNORMAL HIGH (ref 1.12–1.23)
Glucose, Bld: 91 mg/dL (ref 70–99)
HCT: 38 % (ref 36.0–46.0)
Hemoglobin: 12.9 g/dL (ref 12.0–15.0)
Potassium: 3.2 mEq/L — ABNORMAL LOW (ref 3.5–5.1)

## 2011-11-27 LAB — POCT PREGNANCY, URINE: Preg Test, Ur: NEGATIVE

## 2011-11-27 MED ORDER — POTASSIUM CHLORIDE CRYS ER 20 MEQ PO TBCR
EXTENDED_RELEASE_TABLET | ORAL | Status: AC
  Filled 2011-11-27: qty 1

## 2011-11-27 MED ORDER — METRONIDAZOLE 500 MG PO TABS: 500.0000 mg | ORAL_TABLET | Freq: Two times a day (BID) | ORAL | Status: AC

## 2011-11-27 MED ORDER — ONDANSETRON 8 MG PO TBDP: 8.0000 mg | ORAL_TABLET | Freq: Three times a day (TID) | ORAL | Status: AC | PRN

## 2011-11-27 MED ORDER — OMEPRAZOLE 20 MG PO CPDR: 20.0000 mg | DELAYED_RELEASE_CAPSULE | Freq: Two times a day (BID) | ORAL | Status: DC

## 2011-11-27 MED ORDER — POTASSIUM CHLORIDE CRYS ER 20 MEQ PO TBCR
20.0000 meq | EXTENDED_RELEASE_TABLET | Freq: Once | ORAL | Status: AC
  Administered 2011-11-27: 20 meq via ORAL

## 2011-11-27 NOTE — ED Notes (Signed)
Pt  Is  A  Vague  Historian  She  First   Reported  That  She  Was  C/o  Nausea  And  Diarrhea   Then  Stated  She  Wants  To  Be  Checked     For  Herpes   -  She  Reports  She  Was  Treated  About  1  Month  Ago  At  [facility]  And  Has  Symptoms  Of  Vaginal burning  Sensations

## 2011-11-27 NOTE — ED Provider Notes (Signed)
Chief Complaint  Patient presents with  . Nausea    History of Present Illness:    Mary Heath is a 41 year old female who presents with multiple problems today including nausea, oral ulcers, fatigue, anorexia, constipation, dysuria, discharge, and leg cramps. The nausea has been going on this past week and it's not every day but occurred about 3 or 4 days of the week this week. When it did occur it was was constant and would last all day at a time. She would feel worse with the smell of food. She has vomited about 2-3 times during the week. She notes periumbilical abdominal pain that comes and goes sometimes with nausea and sometimes without. She has a six-month history of an umbilical hernia. The pain is cramping and rated 6-1/2 over 10 in intensity. She attributes this to worry and stress. She's stressed out about work, money, and bills. She wonders if she might have herpes and wanted to be checked for all STDs. Sometimes her face feels funny. She has blisters on her lips at times and tingling and swelling of her lips. She feels tired and rundown and was not sleeping well. She's had episodes of feeling feverish. She doesn't have much of an appetite, but she's not sure if she lost any weight. She is constipated. Denies any blood in the stool. Has had some dysuria at times. She also notes discharge, odor, and occasional pelvic pain. Her last menstrual period was around the end of June. She is sexually active and occasionally uses condoms, but she has had a bilateral tubal ligation. She also notes occasional leg cramps.  Review of Systems:  Other than noted above, the patient denies any of the following symptoms: Constitutional:  No fever, chills, fatigue, weight loss or anorexia. Lungs:  No cough or shortness of breath. Heart:  No chest pain, palpitations, syncope or edema.  No cardiac history. Abdomen:  No nausea, vomiting, hematememesis, melena, diarrhea, or hematochezia. GU:  No dysuria, frequency,  urgency, or hematuria. Gyn:  No vaginal discharge, itching, abnormal bleeding, dyspareunia, or pelvic pain. Skin:  No rash or itching.  PMFSH:  Past medical history, family history, social history, meds, and allergies were reviewed along with nurse's notes.  No prior abdominal surgeries, past history of GI problems, STDs or GYN problems.  No history of aspirin or NSAID use.  No excessive alcohol intake.  Physical Exam:   Vital signs:  BP 112/62  Pulse 101  Temp 98.7 F (37.1 C) (Oral)  Resp 16  SpO2 99%  LMP 11/11/2011 Gen:  Alert, oriented, in no distress. Lungs:  Breath sounds clear and equal bilaterally.  No wheezes, rales or rhonchi. Heart:  Regular rhythm.  No gallops or murmers.   Abdomen:  Abdomen was soft, flat, nondistended. She has a small umbilical hernia that was not completely reducible and was mildly tender to palpation. She has mild, diffuse tenderness to palpation, most markedly over the mid abdomen without any guarding or rebound. There is no organomegaly or mass. Bowel sounds are normally active. Pelvic:  External genitalia unremarkable, vaginal mucosa was normal with a small amount of discharge. Cervix was normal except for a white spot at the 1:00 position on the cervix. The patient was informed of this and told it could be a cancerous or precancerous condition and she was urged to followup with her primary care physician. There was no cervical motion pain. Uterus was normal in size and shape and there were no adnexal masses or tenderness. Skin:  Clear, warm and dry.  No rash.  Labs:   Results for orders placed during the hospital encounter of 11/27/11  POCT URINALYSIS DIP (DEVICE)      Component Value Range   Glucose, UA NEGATIVE  NEGATIVE mg/dL   Bilirubin Urine NEGATIVE  NEGATIVE   Ketones, ur NEGATIVE  NEGATIVE mg/dL   Specific Gravity, Urine >=1.030  1.005 - 1.030   Hgb urine dipstick NEGATIVE  NEGATIVE   pH 5.5  5.0 - 8.0   Protein, ur NEGATIVE  NEGATIVE mg/dL    Urobilinogen, UA 0.2  0.0 - 1.0 mg/dL   Nitrite NEGATIVE  NEGATIVE   Leukocytes, UA NEGATIVE  NEGATIVE  POCT PREGNANCY, URINE      Component Value Range   Preg Test, Ur NEGATIVE  NEGATIVE  WET PREP, GENITAL      Component Value Range   Yeast Wet Prep HPF POC NONE SEEN  NONE SEEN   Trich, Wet Prep NONE SEEN  NONE SEEN   Clue Cells Wet Prep HPF POC MODERATE (*) NONE SEEN   WBC, Wet Prep HPF POC MODERATE (*) NONE SEEN  POCT I-STAT, CHEM 8      Component Value Range   Sodium 144  135 - 145 mEq/L   Potassium 3.2 (*) 3.5 - 5.1 mEq/L   Chloride 105  96 - 112 mEq/L   BUN 7  6 - 23 mg/dL   Creatinine, Ser 1.61  0.50 - 1.10 mg/dL   Glucose, Bld 91  70 - 99 mg/dL   Calcium, Ion 0.96 (*) 1.12 - 1.23 mmol/L   TCO2 25  0 - 100 mmol/L   Hemoglobin 12.9  12.0 - 15.0 g/dL   HCT 04.5  40.9 - 81.1 %    Other Labs Obtained at Urgent Care Center:  Serologies were obtained for herpes, syphilis, and HIV as well as DNA probe for gonorrhea and Chlamydia.  Results are pending at this time and we will call about any positive results.  Course in Urgent Care Center:   She was given potassium chloride 20 mEq by mouth and tolerated this well without any immediate side effects.  Assessment:  The primary encounter diagnosis was Nausea. Diagnoses of Abdominal pain, Umbilical hernia, Cervical lesion, Hypercalcemia, Hypokalemia, and Bacterial vaginosis were also pertinent to this visit. The nausea could be coming from gastritis or ulcer disease. I don't think it's coming from the umbilical hernia, this might be causing some of the pain and does need to be fixed. The patient was also informed about the right lesion on the cervix and the fact that this would need to be followed up. I also recommended following up on her hypercalcemia and hypokalemia. She was given some dietary hints and a single dose of potassium chloride here.  Plan:   1.  The following meds were prescribed:   New Prescriptions   METRONIDAZOLE  (FLAGYL) 500 MG TABLET    Take 1 tablet (500 mg total) by mouth 2 (two) times daily.   OMEPRAZOLE (PRILOSEC) 20 MG CAPSULE    Take 1 capsule (20 mg total) by mouth 2 (two) times daily before a meal.   ONDANSETRON (ZOFRAN ODT) 8 MG DISINTEGRATING TABLET    Take 1 tablet (8 mg total) by mouth every 8 (eight) hours as needed for nausea.   2.  The patient was instructed in symptomatic care and handouts were given. 3.  The patient was told to return if becoming worse in any way, if no better in 3  or 4 days, and given some red flag symptoms that would indicate earlier return.  The patient was told that the differential diagnosis includes cancer, and that it was of the utmost importance to followup with the specialist to whom she was referred.     Reuben Likes, MD 11/27/11 2155

## 2011-11-28 LAB — RPR: RPR Ser Ql: NONREACTIVE

## 2011-11-30 NOTE — ED Notes (Signed)
GC/Chlamydia neg., Wet prep: mod. clue cells, mod. WBC's, HIV/RPR non-reactive.  HSV 2 pending.  Pt. adequately treated with Flagyl. Vassie Moselle 11/30/2011

## 2011-12-01 ENCOUNTER — Telehealth (HOSPITAL_COMMUNITY): Payer: Self-pay | Admitting: *Deleted

## 2011-12-01 NOTE — ED Notes (Signed)
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 Other labs documented on ED narrator.  Lab shown to [employer] PA.  She said to tell pt. 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  Call her PCP for suppression therapy.  Follow-up with PCP for cervical lesion as instructed by Dr. Lorenz Coaster. I called pt.  Pt. verified x 2 and given results.  Pt. told she was adequately treated with Flagyl for bacterial vaginosis. Pt. given Karen's instructions about HSV2.  She asked if the information about the cervical lesion was on her d/c instructions. I accessed her chart and told her it was.  I told her to call [facility] and tell them we told you to f/u in 1 week to get a pap smear for a lesion on your cervix.  They will talk to their triage nurse and get you in sooner. When she is there she can tell her doctor about the HSV 2 result.  Pt. Voiced understanding. Vassie Moselle 12/01/2011

## 2011-12-10 ENCOUNTER — Inpatient Hospital Stay (HOSPITAL_COMMUNITY)
Admission: AD | Admit: 2011-12-10 | Discharge: 2011-12-11 | Disposition: A | Discharge status: Home / Self Care | Payer: Self-pay | Source: Ambulatory Visit | Attending: Family Medicine | Admitting: Family Medicine

## 2011-12-10 DIAGNOSIS — N859 Noninflammatory disorder of uterus, unspecified: Secondary | ICD-10-CM | POA: Insufficient documentation

## 2011-12-10 DIAGNOSIS — Z09 Encounter for follow-up examination after completed treatment for conditions other than malignant neoplasm: Secondary | ICD-10-CM

## 2011-12-10 HISTORY — DX: Herpesviral infection, unspecified: B00.9

## 2011-12-10 NOTE — MAU Note (Signed)
Pt was seen on 07/05, at [facility] and was told she had a white spot on cervix and and was given appoint on 07/12 but missed that appoint and was told it would be 07/26 but pt states that is too long to wait to find out what is wrong. States she is worried it may be cancer. Was also told she has HSV2 , low potassium and high calcium

## 2011-12-11 ENCOUNTER — Encounter (HOSPITAL_COMMUNITY): Payer: Self-pay

## 2011-12-11 DIAGNOSIS — Z09 Encounter for follow-up examination after completed treatment for conditions other than malignant neoplasm: Secondary | ICD-10-CM

## 2011-12-11 NOTE — MAU Provider Note (Signed)
Chart reviewed and agree with management and plan.  

## 2011-12-11 NOTE — MAU Note (Signed)
Patient is in and requests for the "spot on her cervix" to be removed today. She states that she was at [facility] on July 5th and was told that she have a spot on her cervix that can be cancer. She was unable to make the f/u appt  With them. She states that she was unable to purchase the prescriptions also ( flagyl, prilosec and zofran odt).

## 2011-12-11 NOTE — MAU Provider Note (Signed)
History     CSN: [id number]  Arrival date & time 12/10/11  2320   None     No chief complaint on file.   HPI Mary Heath is a 41 y.o. female who presents to [facility] for "white spots on her cervix". She was evaluated @ [facility] 11/27/11 for multiple complaints. She was told that she had spots on her cervix that could be cancer. The patient had a follow up appointment with [facility] (her PCP) but did not make the appointment last week. She is rescheduled for 2 weeks. She comes in tonight because she is concerned and wanted to to evaluated tonight. She has no pain or bleeding. Her cultures from her 7/5 visit for GC and Chlamydia were negative. Her culture for HSV -2 was positive, HIV negative, syphilis screen was negative. Pregnancy test was negative. BTL for birth control. Treated for HSV and BV. The history was provided by the patient and her medical record.   Past Medical History  Diagnosis Date  . HSV-2 infection     Past Surgical History  Procedure Date  . Btl     History reviewed. No pertinent family history.  History  Substance Use Topics  . Smoking status: Never Smoker   . Smokeless tobacco: Not on file  . Alcohol Use: No    OB History    Grav Para Term Preterm Abortions TAB SAB Ect Mult Living   5 5 5       5       Review of Systems: As stated in HPI  Allergies  Review of patient's allergies indicates no known allergies.  Home Medications  No current outpatient prescriptions on file.  BP 130/87  Pulse 89  Temp 98.4 F (36.9 C) (Oral)  Resp 18  Ht 5\' 3"  (1.6 m)  Wt 251 lb (113.853 kg)  BMI 44.46 kg/m2  SpO2 100%  LMP 11/30/2011  Physical Exam  Nursing note and vitals reviewed. Constitutional: She appears well-developed and well-nourished. No distress.  HENT:  Head: Normocephalic.  Cardiovascular: Normal rate.   Pulmonary/Chest: Effort normal.  Psychiatric: Her behavior is normal.   Assessment: Cervical lesions per Dr. Lorenz Coaster on  11/27/11  Plan:  Keep follow up appointment with [facility]    I have reviewed this patient's vital signs, nurses notes, appropriate labs from previous visit and notes from the physician. I have discussed in detail with the patient that she will need to keep her appointment with [facility] for follow up. I discussed with the patient that we do not do pap smears, cancer screenings or routine care in [facility]. I encouraged her to call [facility] to try and move her appointment to an earlier date. The patient voices understanding.   ED Course: Discussed with Dr. Shawnie Pons and she agrees with plan of care  Procedures  MDM

## 2011-12-25 ENCOUNTER — Telehealth (HOSPITAL_COMMUNITY): Payer: Self-pay | Admitting: *Deleted

## 2011-12-25 NOTE — ED Notes (Signed)
Pt. called and said [facility] is closing and they won't give her a refill of the Acyclovir.  I told her to call again and see if she can get in to be seen before they close, so she can get a 3 month supply of the medicine.  If not she can come back her to be rechecked but we don't do refills here. Vassie Moselle 12/25/2011

## 2012-05-02 ENCOUNTER — Emergency Department (HOSPITAL_COMMUNITY)
Admission: EM | Admit: 2012-05-02 | Discharge: 2012-05-02 | Disposition: A | Discharge status: Home / Self Care | Payer: Self-pay | Attending: Emergency Medicine | Admitting: Emergency Medicine

## 2012-05-02 ENCOUNTER — Encounter (HOSPITAL_COMMUNITY): Payer: Self-pay | Admitting: Emergency Medicine

## 2012-05-02 DIAGNOSIS — M25569 Pain in unspecified knee: Secondary | ICD-10-CM | POA: Insufficient documentation

## 2012-05-02 DIAGNOSIS — M222X9 Patellofemoral disorders, unspecified knee: Secondary | ICD-10-CM

## 2012-05-02 DIAGNOSIS — R109 Unspecified abdominal pain: Secondary | ICD-10-CM | POA: Insufficient documentation

## 2012-05-02 DIAGNOSIS — Z8619 Personal history of other infectious and parasitic diseases: Secondary | ICD-10-CM | POA: Insufficient documentation

## 2012-05-02 DIAGNOSIS — E669 Obesity, unspecified: Secondary | ICD-10-CM | POA: Insufficient documentation

## 2012-05-02 MED ORDER — NAPROXEN 500 MG PO TABS: 500.0000 mg | ORAL_TABLET | Freq: Two times a day (BID) | ORAL | Status: DC

## 2012-05-02 MED ORDER — NAPROXEN 250 MG PO TABS
500.0000 mg | ORAL_TABLET | Freq: Once | ORAL | Status: AC
  Administered 2012-05-02: 500 mg via ORAL
  Filled 2012-05-02: qty 2

## 2012-05-02 NOTE — ED Notes (Signed)
Pt c/o right knee pain x several days; pt denies obvious injury

## 2012-05-02 NOTE — ED Provider Notes (Signed)
History   This chart was scribed for Jones Skene, MD by Leone Payor, ED Scribe. This patient was seen in room TR09C/TR09C and the patient's care was started at 10:58AM.   CSN: [id number]  Arrival date & time 05/02/12  1045   None     Chief Complaint  Patient presents with  . Knee Pain     The history is provided by the patient. No language interpreter was used.    Mary Heath is a 41 y.o. female who presents to the Emergency Department complaining of a new, constant right knee pain radiating down to leg starting 1 week ago. Pt denies any obvious injury to the affected area or having dislocated her right knee in the past. She states that pain is worse with lying down, standing up for long periods of time, and walking up/down stairs and reports she having numbness and tingling in the foot upon sitting for long periods of time. Pt states taking tylenol and Goodies powder at home with no relief. Pt is also positive for upper abdominal pain possibly due to indigestion and swelling in ankles that is relieved with laying down. She denies any chest pain, SOB, cough, sore throat.    Pt has h/o HSV-2 infection and Btl.  Pt denies smoking and alcohol use. Past Medical History  Diagnosis Date  . HSV-2 infection     Past Surgical History  Procedure Date  . Btl     History reviewed. No pertinent family history.  History  Substance Use Topics  . Smoking status: Never Smoker   . Smokeless tobacco: Not on file  . Alcohol Use: No    OB History    Grav Para Term Preterm Abortions TAB SAB Ect Mult Living   5 5 5       5       Review of Systems At least 10pt or greater review of systems completed and are negative except where specified in the HPI.  Allergies  Review of patient's allergies indicates no known allergies.  Home Medications   Current Outpatient Rx  Name  Route  Sig  Dispense  Refill  . OMEPRAZOLE 20 MG PO CPDR   Oral   Take 1 capsule (20 mg total) by mouth 2  (two) times daily before a meal.   30 capsule   0     BP 118/70  Pulse 89  Temp 97.6 F (36.4 C) (Oral)  Resp 18  SpO2 98%  Physical Exam  Nursing notes reviewed.  Electronic medical record reviewed. VITAL SIGNS:   Filed Vitals:   05/02/12 1050  BP: 118/70  Pulse: 89  Temp: 97.6 F (36.4 C)  TempSrc: Oral  Resp: 18  SpO2: 98%   CONSTITUTIONAL: Awake, oriented, appears non-toxic HENT: Atraumatic, normocephalic, oral mucosa pink and moist, airway patent. Nares patent without drainage. External ears normal. EYES: Conjunctiva clear, EOMI, PERRLA NECK: Trachea midline, non-tender, supple CARDIOVASCULAR: Normal heart rate, Normal rhythm, No murmurs, rubs, gallops PULMONARY/CHEST: Clear to auscultation, no rhonchi, wheezes, or rales. Symmetrical breath sounds. Non-tender. ABDOMINAL: Non-distended, soft, non-tender - no rebound or guarding.  BS normal. NEUROLOGIC: Non-focal, moving all four extremities, no gross sensory or motor deficits. EXTREMITIES: No clubbing, cyanosis, or edema. Tenderness to palpation along the lateral aspect of the patella and superior lateral aspect. There is pain when this area is compressed toward the femur/tibia. Patient's joint is stable to anterior posterior drawer. Negative McMurray test. SKIN: Warm, Dry, No erythema, No rash  ED Course  Procedures (including critical care time)  DIAGNOSTIC STUDIES: Oxygen Saturation is 98% on room air, normal by my interpretation.    COORDINATION OF CARE:  11:08 AM Discussed treatment plan which includes leg exercises, Aleve/Tylenol, and continued use of leg brace with pt at bedside and pt agreed to plan.    Labs Reviewed - No data to display No results found.   1. PFS (patellofemoral syndrome)   2. Knee pain       MDM  Mary Heath is a 41 y.o. female who is obese and works on her feet - symptoms and physical exam are consistent with patellofemoral syndrome. We'll treat the patient  conservatively with naproxen for her pain, and instructed her to keep on using her resources for support while she works, and instructed her that she should likely get new shoes when she can afford them and given her a printout of exercises to strengthen her quadriceps muscles: [email or web address].  Exam is not consistent with an acute injury requiring acute surgical intervention. Neurologically intact. Likely knee pain secondary to occupation and weight.  Urged the patient to find a primary care physician and to avoid taking Goody's powder in conjunction with naproxen. She may also take acetaminophen with naproxen the naproxen is insufficient for pain relief. Also told her to keep an eye on her stools looking for black stools or any abdominal pain which could signify an NSAID-induced ulcer.  I explained the diagnosis and have given explicit precautions to return to the ER including any other new or worsening symptoms. The patient understands and accepts the medical plan as it's been dictated and I have answered their questions. Discharge instructions concerning home care and prescriptions have been given.  The patient is STABLE and is discharged to home in good condition.   I personally performed the services described in this documentation, which was scribed in my presence. The recorded information has been reviewed and is accurate.   Jones Skene, MD 05/02/12 1220

## 2013-11-14 ENCOUNTER — Encounter (HOSPITAL_COMMUNITY): Payer: Self-pay | Admitting: Emergency Medicine

## 2013-11-14 ENCOUNTER — Emergency Department (HOSPITAL_COMMUNITY)
Admission: EM | Admit: 2013-11-14 | Discharge: 2013-11-14 | Disposition: A | Discharge status: Home / Self Care | Payer: BC Managed Care – PPO | Attending: Emergency Medicine | Admitting: Emergency Medicine

## 2013-11-14 ENCOUNTER — Emergency Department (HOSPITAL_COMMUNITY): Payer: BC Managed Care – PPO

## 2013-11-14 DIAGNOSIS — M25561 Pain in right knee: Secondary | ICD-10-CM

## 2013-11-14 DIAGNOSIS — M1711 Unilateral primary osteoarthritis, right knee: Secondary | ICD-10-CM

## 2013-11-14 DIAGNOSIS — I8390 Asymptomatic varicose veins of unspecified lower extremity: Secondary | ICD-10-CM | POA: Insufficient documentation

## 2013-11-14 DIAGNOSIS — Z791 Long term (current) use of non-steroidal anti-inflammatories (NSAID): Secondary | ICD-10-CM | POA: Insufficient documentation

## 2013-11-14 DIAGNOSIS — M1710 Unilateral primary osteoarthritis, unspecified knee: Secondary | ICD-10-CM | POA: Insufficient documentation

## 2013-11-14 DIAGNOSIS — IMO0002 Reserved for concepts with insufficient information to code with codable children: Secondary | ICD-10-CM | POA: Insufficient documentation

## 2013-11-14 DIAGNOSIS — M79604 Pain in right leg: Secondary | ICD-10-CM

## 2013-11-14 DIAGNOSIS — Z8619 Personal history of other infectious and parasitic diseases: Secondary | ICD-10-CM | POA: Insufficient documentation

## 2013-11-14 MED ORDER — TRAMADOL HCL 50 MG PO TABS: 50.0000 mg | ORAL_TABLET | Freq: Four times a day (QID) | ORAL | Status: DC | PRN

## 2013-11-14 MED ORDER — MELOXICAM 7.5 MG PO TABS: 15.0000 mg | ORAL_TABLET | Freq: Every day | ORAL | Status: DC

## 2013-11-14 NOTE — ED Provider Notes (Signed)
CSN: [id number]     Arrival date & time 11/14/13  1519 History   None    This chart was scribed for non-physician practitioner, Junius FinnerErin O'Malley PA-C, working with Lyanne CoKevin M Campos, MD by Arlan OrganAshley Leger, ED Scribe. This patient was seen in room TR05C/TR05C and the patient's care was started at 4:05 PM.   Chief Complaint  Patient presents with  . Leg Pain   The history is provided by the patient. No language interpreter was used.    HPI Comments: Mary Heath is a 43 y.o. female who presents to the Emergency Department complaining of constant, persistent, moderate R anterior lower extremity pain x 4 months that is unchanged. No recent injury or trauma. She also reports pain to her R knee. She describes pain as sharp and currently rates it 8/10. Pt states pain is alleviating without any footwear. No aggravating factors at this time. She has tried OTC Ibuprofen without any improvement for symptoms. At this time she denies any fever, chills, numbness, loss of sensation, or weakness. States she has followed up with a family provider but says she has not noted any improvement for symptoms. She denies a personal history of blood clots but admits to a family history of blood clots. No known allergies to medications. She has no other pertinent past medical history. No other concerns this visit.  Past Medical History  Diagnosis Date  . HSV-2 infection    Past Surgical History  Procedure Laterality Date  . Btl     No family history on file. History  Substance Use Topics  . Smoking status: Never Smoker   . Smokeless tobacco: Not on file  . Alcohol Use: No   OB History   Grav Para Term Preterm Abortions TAB SAB Ect Mult Living   5 5 5       5      Review of Systems  Constitutional: Negative for fever and chills.  HENT: Negative for congestion.   Eyes: Negative for redness.  Respiratory: Negative for cough.   Musculoskeletal: Positive for arthralgias (R lower extremity and R knee pain).  Skin:  Negative for rash.  Neurological: Negative for weakness and numbness.  Psychiatric/Behavioral: Negative for confusion.      Allergies  Review of patient's allergies indicates no known allergies.  Home Medications   Prior to Admission medications   Medication Sig Start Date End Date Taking? Authorizing Provider  ibuprofen (ADVIL,MOTRIN) 200 MG tablet Take 200 mg by mouth every 6 (six) hours as needed.   Yes Historical Provider, MD  OVER THE COUNTER MEDICATION Apply 1 application topically daily as needed (Leg pain). OTC Muscle rub   Yes Historical Provider, MD  meloxicam (MOBIC) 7.5 MG tablet Take 2 tablets (15 mg total) by mouth daily. 11/14/13   Junius FinnerErin O'Malley, PA-C  traMADol (ULTRAM) 50 MG tablet Take 1 tablet (50 mg total) by mouth every 6 (six) hours as needed. 11/14/13   Junius FinnerErin O'Malley, PA-C   Triage Vitals: BP 119/62  Pulse 101  Temp(Src) 98 F (36.7 C) (Oral)  Resp 20  Ht 5\' 3"  (1.6 m)  Wt 257 lb (116.574 kg)  BMI 45.54 kg/m2  SpO2 98%  LMP 10/09/2013   Physical Exam  Nursing note and vitals reviewed. Constitutional: She is oriented to person, place, and time. She appears well-developed and well-nourished.  Sitting in exam chair with R leg propped up Morbidly obese female  HENT:  Head: Normocephalic and atraumatic.  Eyes: EOM are normal.  Neck: Normal  range of motion.  Cardiovascular: Normal rate.   Pulmonary/Chest: Effort normal.  Musculoskeletal: Normal range of motion. She exhibits tenderness. She exhibits no edema.  R leg with varicose veins present Tenderness to palpation over R anterior knee and R anterior lower leg Tenderness to palpation over medial joint space FROM of R knee No crepitus Petal pulses 2 plus sensation intact 5/5 strength   Neurological: She is alert and oriented to person, place, and time.  Skin: Skin is warm and dry.  Psychiatric: She has a normal mood and affect. Her behavior is normal.    ED Course  Procedures (including critical  care time)  DIAGNOSTIC STUDIES: Oxygen Saturation is 98% on RA, Normal by my interpretation.    COORDINATION OF CARE: 4:18 PM- Will order DG Knee Complete 4 Views R. iscussed treatment plan with pt at bedside and pt agreed to plan.     Labs Review Labs Reviewed - No data to display  Imaging Review Dg Knee Complete 4 Views Right  11/14/2013   CLINICAL DATA:  Pain without trauma.  EXAM: RIGHT KNEE - COMPLETE 4+ VIEW  COMPARISON:  None  FINDINGS: Three compartment osteoarthritis. Mild in the medial and lateral compartments. Mild-to-moderate in the patellofemoral compartment. Limited evaluation for joint effusion secondary to patient body habitus. No acute fracture or dislocation.  IMPRESSION: Mild to moderate but age advanced 3 compartment osteoarthritis.   Electronically Signed   By: Jeronimo GreavesKyle  Talbot M.D.   On: 11/14/2013 18:10     EKG Interpretation None      MDM   Final diagnoses:  Right knee pain  Osteoarthritis of right knee, unspecified osteoarthritis type  Right leg pain    Pt c/o right leg pain. Pt did arrive to ED with no shoes on, just green non-slip hospital appearing socks.  Pt stated her work shoes are too tight. Right leg and knee pain appears musculoskeletal likely due to body habitus and lack of proper footwear.  Pt seen 2 years ago for same, dx with patellofemoral syndrome.  Has not f/u with ortho.  Denies new traumas. Denies chest pain, SOB, or hx of blood clots. No calf tenderness. Not concerned for DVT.  Plain films: significant for mild to moderate age advanced 3 compartment osteoarthritis.  Will tx symptomatically for pain. Ace wrap applied. Rx: mobic and tramadol. Advised to f/u with PCP and encouraged pt purchase proper footwear. Return precautions provided. Pt verbalized understanding and agreement with tx plan.   I personally performed the services described in this documentation, which was scribed in my presence. The recorded information has been reviewed and is  accurate.    Junius Finnerrin O'Malley, PA-C 11/14/13 1932

## 2013-11-14 NOTE — ED Notes (Signed)
Right leg pain intermittently over several years, swelling for an unknown period of time, right leg noticeably larger than the left.  varicose veins present, pain is located in the calf.   Denies chest pain or sob, eating [employer] in room, no acute distress noted.    Denies recent travel or surgeries, denies being on birth control, denies any periods of prolonged immobilization.

## 2013-11-14 NOTE — ED Notes (Signed)
Pt. Having rt. Knee pain and rt. Shin pain for over 4 months.  Denies any injuries

## 2013-11-14 NOTE — ED Notes (Signed)
Mary Heath at bedside

## 2013-11-14 NOTE — ED Provider Notes (Signed)
Medical screening examination/treatment/procedure(s) were performed by non-physician practitioner and as supervising physician I was immediately available for consultation/collaboration.   EKG Interpretation None        Kevin M Campos, MD 11/14/13 2355 

## 2014-03-05 ENCOUNTER — Ambulatory Visit: Payer: BC Managed Care – PPO

## 2014-03-26 ENCOUNTER — Encounter (HOSPITAL_COMMUNITY): Payer: Self-pay | Admitting: Emergency Medicine

## 2014-04-06 ENCOUNTER — Other Ambulatory Visit (HOSPITAL_COMMUNITY): Payer: Self-pay | Admitting: Family Medicine

## 2014-04-06 DIAGNOSIS — Z1231 Encounter for screening mammogram for malignant neoplasm of breast: Secondary | ICD-10-CM

## 2014-04-20 ENCOUNTER — Ambulatory Visit (HOSPITAL_COMMUNITY): Payer: BC Managed Care – PPO

## 2014-08-20 ENCOUNTER — Other Ambulatory Visit (HOSPITAL_COMMUNITY): Payer: Self-pay | Admitting: Primary Care

## 2014-08-20 ENCOUNTER — Other Ambulatory Visit (HOSPITAL_COMMUNITY): Payer: Self-pay

## 2014-08-20 DIAGNOSIS — Z1231 Encounter for screening mammogram for malignant neoplasm of breast: Secondary | ICD-10-CM

## 2014-08-23 ENCOUNTER — Ambulatory Visit (HOSPITAL_COMMUNITY)
Admission: RE | Admit: 2014-08-23 | Discharge: 2014-08-23 | Disposition: A | Discharge status: Home / Self Care | Payer: Self-pay | Source: Ambulatory Visit | Attending: Primary Care | Admitting: Primary Care

## 2014-08-23 DIAGNOSIS — Z1231 Encounter for screening mammogram for malignant neoplasm of breast: Secondary | ICD-10-CM

## 2014-08-27 ENCOUNTER — Other Ambulatory Visit: Payer: Self-pay | Admitting: Primary Care

## 2014-08-27 DIAGNOSIS — R928 Other abnormal and inconclusive findings on diagnostic imaging of breast: Secondary | ICD-10-CM

## 2014-09-03 ENCOUNTER — Ambulatory Visit
Admission: RE | Admit: 2014-09-03 | Discharge: 2014-09-03 | Disposition: A | Discharge status: Home / Self Care | Payer: No Typology Code available for payment source | Source: Ambulatory Visit | Attending: Primary Care | Admitting: Primary Care

## 2014-09-03 DIAGNOSIS — R928 Other abnormal and inconclusive findings on diagnostic imaging of breast: Secondary | ICD-10-CM

## 2016-07-03 ENCOUNTER — Encounter (HOSPITAL_COMMUNITY): Payer: Self-pay | Admitting: Emergency Medicine

## 2016-07-03 ENCOUNTER — Emergency Department (HOSPITAL_COMMUNITY)
Admission: EM | Admit: 2016-07-03 | Discharge: 2016-07-03 | Disposition: A | Discharge status: Home / Self Care | Payer: Self-pay | Attending: Emergency Medicine | Admitting: Emergency Medicine

## 2016-07-03 ENCOUNTER — Emergency Department (HOSPITAL_COMMUNITY): Payer: Self-pay

## 2016-07-03 DIAGNOSIS — R079 Chest pain, unspecified: Secondary | ICD-10-CM

## 2016-07-03 DIAGNOSIS — R0789 Other chest pain: Secondary | ICD-10-CM | POA: Insufficient documentation

## 2016-07-03 DIAGNOSIS — R1010 Upper abdominal pain, unspecified: Secondary | ICD-10-CM | POA: Insufficient documentation

## 2016-07-03 LAB — I-STAT TROPONIN, ED
Troponin i, poc: 0 ng/mL (ref 0.00–0.08)
Troponin i, poc: 0 ng/mL (ref 0.00–0.08)

## 2016-07-03 LAB — CBC
HEMATOCRIT: 38.1 % (ref 36.0–46.0)
HEMOGLOBIN: 12.4 g/dL (ref 12.0–15.0)
MCH: 29.7 pg (ref 26.0–34.0)
MCHC: 32.5 g/dL (ref 30.0–36.0)
MCV: 91.4 fL (ref 78.0–100.0)
Platelets: 219 10*3/uL (ref 150–400)
RBC: 4.17 MIL/uL (ref 3.87–5.11)
RDW: 14.3 % (ref 11.5–15.5)
WBC: 9.8 10*3/uL (ref 4.0–10.5)

## 2016-07-03 LAB — BASIC METABOLIC PANEL
Anion gap: 10 (ref 5–15)
BUN: 13 mg/dL (ref 6–20)
CO2: 27 mmol/L (ref 22–32)
Calcium: 9.7 mg/dL (ref 8.9–10.3)
Chloride: 103 mmol/L (ref 101–111)
Creatinine, Ser: 0.73 mg/dL (ref 0.44–1.00)
GFR calc Af Amer: >60 mL/min (ref >60)
GLUCOSE: 126 mg/dL — AB (ref 65–99)
POTASSIUM: 3.7 mmol/L (ref 3.5–5.1)
Sodium: 140 mmol/L (ref 135–145)

## 2016-07-03 LAB — HEPATIC FUNCTION PANEL
ALT: 39 U/L (ref 14–54)
AST: 82 U/L — ABNORMAL HIGH (ref 15–41)
Albumin: 3.5 g/dL (ref 3.5–5.0)
Alkaline Phosphatase: 61 U/L (ref 38–126)
Bilirubin, Direct: 0.1 mg/dL (ref 0.1–0.5)
Indirect Bilirubin: 0.4 mg/dL (ref 0.3–0.9)
Total Bilirubin: 0.5 mg/dL (ref 0.3–1.2)
Total Protein: 7.3 g/dL (ref 6.5–8.1)

## 2016-07-03 LAB — D-DIMER, QUANTITATIVE (NOT AT ARMC): D-Dimer, Quant: <0.27 ug/mL-FEU (ref 0.00–0.50)

## 2016-07-03 LAB — LIPASE, BLOOD: Lipase: 18 U/L (ref 11–51)

## 2016-07-03 LAB — CBG MONITORING, ED: Glucose-Capillary: 110 mg/dL — ABNORMAL HIGH (ref 65–99)

## 2016-07-03 MED ORDER — HYDROCODONE-ACETAMINOPHEN 5-325 MG PO TABS
1.0000 | ORAL_TABLET | Freq: Once | ORAL | Status: AC
  Administered 2016-07-03: 1 via ORAL
  Filled 2016-07-03: qty 1

## 2016-07-03 MED ORDER — IBUPROFEN 800 MG PO TABS: 800.0000 mg | ORAL_TABLET | Freq: Three times a day (TID) | ORAL | 0 refills | Status: DC | PRN

## 2016-07-03 NOTE — ED Triage Notes (Signed)
Pt BIB EMS from home. Pt sleeping when she woke up to CP that radiated to her epigastric area. Denies other s/sx. States this happened recently and last for 15-2430min before resolving. ECG "unremarkable" from EMS. Pt took 324mg  ASA prior to EMS arrival.

## 2016-07-03 NOTE — ED Notes (Signed)
Patient transported to X-ray 

## 2016-07-03 NOTE — ED Notes (Signed)
Family at bedside. 

## 2016-07-03 NOTE — ED Notes (Signed)
Patient transported to Ultrasound 

## 2016-07-03 NOTE — Discharge Instructions (Signed)
Return here as needed.  Follow-up with the cardiologist provided next week as scheduled

## 2016-07-03 NOTE — ED Provider Notes (Signed)
[facility]-EMERGENCY DEPT Provider Note   CSN: [id number] Arrival date & time: 07/03/16  2130     History   Chief Complaint Chief Complaint  Patient presents with  . Chest Pain    HPI Mary Heath is a 46 y.o. female.  HPI Patient presents to the emergency department with chest discomfort in the center of her chest.  Patient states this started At 2 AM the patient states she also had some upper abdominal pain associated with it.  The patient states that nothing seems to make the condition better or worse.  She states that she did not take any medications prior to arrival.  She states the pain has been constant.The patient denies shortness of breath, headache,blurred vision, neck pain, fever, cough, weakness, numbness, dizziness, anorexia, edema, nausea, vomiting, diarrhea, rash, back pain, dysuria, hematemesis, bloody stool, near syncope, or syncope.Patient is also complaining of upper abdominal pain Past Medical History:  Diagnosis Date  . HSV-2 infection     Patient Active Problem List   Diagnosis Date Noted  . DEPRESSION 01/14/2010  . BACK PAIN 01/14/2010  . HSV 06/20/2009  . HYPERCHOLESTEROLEMIA 06/19/2009  . VARICOSE VEINS, LOWER EXTREMITIES 06/19/2009  . TINGLING 06/19/2009  . LEG PAIN, LEFT 06/06/2009  . 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 ONYCHOMYCOSIS 12/05/2007  . OBESITY 12/05/2007  . UMBILICAL HERNIA 12/05/2007    Past Surgical History:  Procedure Laterality Date  . btl      OB History    Gravida Para Term Preterm AB Living   5 5 5     5    SAB TAB Ectopic Multiple Live Births                   Home Medications    Prior to Admission medications   Medication Sig Start Date End Date Taking? Authorizing Provider  meloxicam (MOBIC) 7.5 MG tablet Take 2 tablets (15 mg total) by mouth daily. Patient not taking: Reported on 07/03/2016 11/14/13   Junius Finner, PA-C  traMADol (ULTRAM) 50 MG tablet Take 1 tablet (50 mg total) by mouth every 6 (six) hours as  needed. Patient not taking: Reported on 07/03/2016 11/14/13   Junius Finner, PA-C    Family History History reviewed. No pertinent family history.  Social History Social History  Substance Use Topics  . Smoking status: Never Smoker  . Smokeless tobacco: Never Used  . Alcohol use No     Allergies   Patient has no known allergies.   Review of Systems Review of Systems  All other systems negative except as documented in the HPI. All pertinent positives and negatives as reviewed in the HPI.  Physical Exam Updated Vital Signs BP 141/82 (BP Location: Right Arm)   Pulse 80   Temp 97.7 F (36.5 C) (Oral)   Resp 20   Ht 5\' 4"  (1.626 m)   LMP 06/19/2016 (Exact Date)   SpO2 97%   Physical Exam  Constitutional: She is oriented to person, place, and time. She appears well-developed and well-nourished. No distress.  HENT:  Head: Normocephalic and atraumatic.  Mouth/Throat: Oropharynx is clear and moist.  Eyes: Pupils are equal, round, and reactive to light.  Neck: Normal range of motion. Neck supple.  Cardiovascular: Normal rate, regular rhythm and normal heart sounds.  Exam reveals no gallop and no friction rub.   No murmur heard. Pulmonary/Chest: Effort normal and breath sounds normal. No respiratory distress. She has no wheezes.  Abdominal: Soft. Bowel sounds are normal. She exhibits no  distension. There is tenderness.    Neurological: She is alert and oriented to person, place, and time. She exhibits normal muscle tone. Coordination normal.  Skin: Skin is warm and dry. No rash noted. No erythema.  Psychiatric: She has a normal mood and affect. Her behavior is normal.  Nursing note and vitals reviewed.    ED Treatments / Results  Labs (all labs ordered are listed, but only abnormal results are displayed) Labs Reviewed  BASIC METABOLIC PANEL - Abnormal; Notable for the following:       Result Value   Glucose, Bld 126 (*)    All other components within normal limits    HEPATIC FUNCTION PANEL - Abnormal; Notable for the following:    AST 82 (*)    All other components within normal limits  CBG MONITORING, ED - Abnormal; Notable for the following:    Glucose-Capillary 110 (*)    All other components within normal limits  CBC  D-DIMER, QUANTITATIVE (NOT AT [facility])  LIPASE, BLOOD  I-STAT TROPOININ, ED  I-STAT TROPOININ, ED    EKG  EKG Interpretation  Date/Time:  Friday July 03 2016 05:32:23 EST Ventricular Rate:  87 PR Interval:    QRS Duration: 80 QT Interval:  364 QTC Calculation: 438 R Axis:   26 Text Interpretation:  Sinus rhythm Probable left atrial enlargement Abnormal R-wave progression, early transition No previous ECGs available Confirmed by Bebe Shaggy  MD, DONALD ([id number]) on 07/03/2016 5:36:49 AM       Radiology Dg Chest 2 View  Result Date: 07/03/2016 CLINICAL DATA:  Chest pain for 1 hour. Sharp pain in the middle of the chest. Nonsmoker. EXAM: CHEST  2 VIEW COMPARISON:  None. FINDINGS: The heart size and mediastinal contours are within normal limits. Both lungs are clear. The visualized skeletal structures are unremarkable. IMPRESSION: No active cardiopulmonary disease. Electronically Signed   By: Burman Nieves M.D.   On: 07/03/2016 06:27   US Abdomen Limited Ruq  Result Date: 07/03/2016 CLINICAL DATA:  Upper abdominal pain EXAM: US ABDOMEN LIMITED - RIGHT UPPER QUADRANT COMPARISON:  None. FINDINGS: Gallbladder: No gallstones or wall thickening visualized. No sonographic Murphy sign noted by sonographer. Common bile duct: Diameter: Normal at 4 mm Liver: No focal lesion identified. Within normal limits in parenchymal echogenicity. IMPRESSION: Normal right upper quadrant ultrasound. Electronically Signed   By: Genevive Bi M.D.   On: 07/03/2016 12:55    Procedures Procedures (including critical care time)  Medications Ordered in ED Medications  HYDROcodone-acetaminophen (NORCO/VICODIN) 5-325 MG per tablet 1 tablet (1 tablet Oral  Given 07/03/16 1126)     Initial Impression / Assessment and Plan / ED Course  I have reviewed the triage vital signs and the nursing notes.  Pertinent labs & imaging results that were available during my care of the patient were reviewed by me and considered in my medical decision making (see chart for details).    Patient has atypical chest pain has been constant since the onset.  The patient's main complaint seems to be the upper abdominal pain.  Patient be referred back to her primary doctor, but also set up an appointment with cardiology for February 15 for further evaluation ensure that this is not cardiac-related issue.  Patient agrees the plan and all questions were answered   Final Clinical Impressions(s) / ED Diagnoses  Told to return here for any worsening in her condition Final diagnoses:  Upper abdominal pain    New Prescriptions New Prescriptions   No medications  on file     Charlestine NightChristopher Job Holtsclaw, PA-C 07/05/16 1546    Benjiman CoreNathan Pickering, MD 07/06/16 [phone]

## 2016-07-09 ENCOUNTER — Encounter: Payer: Self-pay | Admitting: Physician Assistant

## 2016-07-09 ENCOUNTER — Ambulatory Visit: Payer: No Typology Code available for payment source | Admitting: Physician Assistant

## 2016-07-09 NOTE — Progress Notes (Deleted)
Cardiology Office Note   Date:  07/09/2016   ID:  Mary Heath, DOB 12/16/1970, MRN [id number]  PCP:  Default, Provider, MD  Cardiologist:  New, Dr. Collier Bullockandolph  Mary Heath, Mary Loserhonda, PA-C   No chief complaint on file.   History of Present Illness: Mary Heath is a 46 y.o. female with a history of depression, HLD, HSV, obesity  02/09 ER visit for chest pain, appointment made  Mary Heath presents for ***   Past Medical History:  Diagnosis Date  . Depression   . HSV-2 infection   . Hyperlipidemia   . Varicose veins of both lower extremities     Past Surgical History:  Procedure Laterality Date  . TUBAL LIGATION      Current Outpatient Prescriptions  Medication Sig Dispense Refill  . ibuprofen (ADVIL,MOTRIN) 800 MG tablet Take 1 tablet (800 mg total) by mouth every 8 (eight) hours as needed. 21 tablet 0  . meloxicam (MOBIC) 7.5 MG tablet Take 2 tablets (15 mg total) by mouth daily. (Patient not taking: Reported on 07/03/2016) 30 tablet 0  . traMADol (ULTRAM) 50 MG tablet Take 1 tablet (50 mg total) by mouth every 6 (six) hours as needed. (Patient not taking: Reported on 07/03/2016) 15 tablet 0   No current facility-administered medications for this visit.     Allergies:   Patient has no known allergies.    Social History:  The patient  reports that she has never smoked. She has never used smokeless tobacco. She reports that she does not drink alcohol or use drugs.   Family History:  The patient's family history is not on file.    ROS:  Please see the history of present illness. All other systems are reviewed and negative.    PHYSICAL EXAM: VS:  LMP 06/19/2016 (Exact Date)  , BMI There is no height or weight on file to calculate BMI. GEN: Well nourished, well developed, female in no acute distress  HEENT: normal for age  Neck: no JVD, no carotid bruit, no masses Cardiac: RRR; no murmur, no rubs, or gallops Respiratory:  clear to auscultation  bilaterally, normal work of breathing GI: soft, nontender, nondistended, + BS MS: no deformity or atrophy; no edema; distal pulses are 2+ in all 4 extremities   Skin: warm and dry, no rash Neuro:  Strength and sensation are intact Psych: euthymic mood, full affect   EKG:  EKG {ACTION; IS/IS [id number]} ordered today. The ekg ordered today demonstrates ***   Recent Labs: 07/03/2016: ALT 39; BUN 13; Creatinine, Ser 0.73; Hemoglobin 12.4; Platelets 219; Potassium 3.7; Sodium 140    Lipid Panel    Component Value Date/Time   CHOL 209 (H) 06/06/2009 2111   TRIG 85 06/06/2009 2111   HDL 50 06/06/2009 2111   CHOLHDL 4.2 Ratio 06/06/2009 2111   VLDL 17 06/06/2009 2111   LDLCALC 142 (H) 06/06/2009 2111     Wt Readings from Last 3 Encounters:  11/14/13 257 lb (116.6 kg)  12/10/11 251 lb (113.9 kg)  03/03/10 (!) 253 lb 1.6 oz (114.8 kg)     Other studies Reviewed: Additional studies/ records that were reviewed today include: ***.  ASSESSMENT AND PLAN:  1.  ***   Current medicines are reviewed at length with the patient today.  The patient {ACTIONS; HAS/DOES NOT HAVE:19233} concerns regarding medicines.  The following changes have been made:  {PLAN; NO CHANGE:13088:s}  Labs/ tests ordered today include: *** No orders of the defined types were  placed in this encounter.    Disposition:   FU with ***  Signed, Theodore Demark, PA-C  07/09/2016 8:08 AM    [facility] Phone: [phone]; Fax: [phone]  This note was written with the assistance of speech recognition software. Please excuse any transcriptional errors.

## 2016-12-22 ENCOUNTER — Emergency Department
Admission: EM | Admit: 2016-12-22 | Discharge: 2016-12-22 | Disposition: A | Discharge status: Home / Self Care | Payer: Worker's Compensation | Attending: Emergency Medicine | Admitting: Emergency Medicine

## 2016-12-22 ENCOUNTER — Encounter: Payer: Self-pay | Admitting: Emergency Medicine

## 2016-12-22 DIAGNOSIS — Y990 Civilian activity done for income or pay: Secondary | ICD-10-CM | POA: Insufficient documentation

## 2016-12-22 DIAGNOSIS — Y929 Unspecified place or not applicable: Secondary | ICD-10-CM | POA: Diagnosis not present

## 2016-12-22 DIAGNOSIS — Y9389 Activity, other specified: Secondary | ICD-10-CM | POA: Insufficient documentation

## 2016-12-22 DIAGNOSIS — S61200A Unspecified open wound of right index finger without damage to nail, initial encounter: Secondary | ICD-10-CM | POA: Insufficient documentation

## 2016-12-22 DIAGNOSIS — S61215A Laceration without foreign body of left ring finger without damage to nail, initial encounter: Secondary | ICD-10-CM | POA: Insufficient documentation

## 2016-12-22 DIAGNOSIS — W290XXA Contact with powered kitchen appliance, initial encounter: Secondary | ICD-10-CM | POA: Insufficient documentation

## 2016-12-22 DIAGNOSIS — S61207A Unspecified open wound of left little finger without damage to nail, initial encounter: Secondary | ICD-10-CM | POA: Insufficient documentation

## 2016-12-22 DIAGNOSIS — S61412A Laceration without foreign body of left hand, initial encounter: Secondary | ICD-10-CM | POA: Diagnosis present

## 2016-12-22 DIAGNOSIS — S61209A Unspecified open wound of unspecified finger without damage to nail, initial encounter: Secondary | ICD-10-CM

## 2016-12-22 NOTE — ED Triage Notes (Signed)
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

## 2016-12-22 NOTE — Discharge Instructions (Signed)
Keep your hand totally dry for the next 24 hours. After 24 hours, remove all of the dressing except for the mesh that is on your pinky finger. Follow-up with the primary care provider of your choice for symptoms of concern. If you are unable to schedule an appointment, return to the emergency department for concerns.

## 2016-12-22 NOTE — ED Provider Notes (Signed)
[facility] Emergency Department Provider Note  ____________________________________________  Time seen: Approximately 5:32 PM  I have reviewed the triage vital signs and the nursing notes.   HISTORY  Chief Complaint Laceration   HPI Mary Heath is a 46 y.o. female who presents to the emergency department for treatment And evaluation of lacerations to the left hand.While at work, she was using a meat slicer and shaved the skin off of the third fourth and fifth fingertips. Middle and ring finger are not actively bleeding, but the pinky finger continues to bleed significantly per the patient. No treatment has been attempted prior to arrival. Tetanus shot is not up-to-date.  Past Medical History:  Diagnosis Date  . Depression   . HSV-2 infection   . Hyperlipidemia   . Varicose veins of both lower extremities     Patient Active Problem List   Diagnosis Date Noted  . DEPRESSION 01/14/2010  . BACK PAIN 01/14/2010  . HSV 06/20/2009  . HYPERCHOLESTEROLEMIA 06/19/2009  . VARICOSE VEINS, LOWER EXTREMITIES 06/19/2009  . TINGLING 06/19/2009  . LEG PAIN, LEFT 06/06/2009  . 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 ONYCHOMYCOSIS 12/05/2007  . OBESITY 12/05/2007  . UMBILICAL HERNIA 12/05/2007    Past Surgical History:  Procedure Laterality Date  . TUBAL LIGATION      Prior to Admission medications   Medication Sig Start Date End Date Taking? Authorizing Provider  ibuprofen (ADVIL,MOTRIN) 800 MG tablet Take 1 tablet (800 mg total) by mouth every 8 (eight) hours as needed. 07/03/16   Lawyer, Cristal Deerhristopher, PA-C  meloxicam (MOBIC) 7.5 MG tablet Take 2 tablets (15 mg total) by mouth daily. Patient not taking: Reported on 07/03/2016 11/14/13   Lurene ShadowPhelps, Erin O, PA-C  traMADol (ULTRAM) 50 MG tablet Take 1 tablet (50 mg total) by mouth every 6 (six) hours as needed. Patient not taking: Reported on 07/03/2016 11/14/13   Lurene ShadowPhelps, Erin O, PA-C    Allergies Patient has no  known allergies.  No family history on file.  Social History Social History  Substance Use Topics  . Smoking status: Never Smoker  . Smokeless tobacco: Never Used  . Alcohol use No    Review of Systems  Constitutional: Negative for recent illness.  Respiratory: Negative for shortness of breath. Cardiovascular:Positive for active bleeding  Musculoskeletal: Negative for joint pain or decreased range of motion.  Skin: Positive for lacerations Neurological: Negative for paresthesias. ____________________________________________   PHYSICAL EXAM:  VITAL SIGNS: ED Triage Vitals  Enc Vitals Group     BP 12/22/16 1719 137/84     Pulse Rate 12/22/16 1719 80     Resp 12/22/16 1719 18     Temp 12/22/16 1719 98.5 F (36.9 C)     Temp Source 12/22/16 1719 Oral     SpO2 12/22/16 1719 98 %     Weight 12/22/16 1719 195 lb (88.5 kg)     Height 12/22/16 1719 5\' 3"  (1.6 m)     Head Circumference --      Peak Flow --      Pain Score 12/22/16 1722 6     Pain Loc --      Pain Edu? --      Excl. in GC? --      Constitutional: Well appearing. Eyes: Conjunctivae are clear without discharge or drainage. Nose: No rhinorrhea noted. Mouth/Throat: Mucous membranes are moist. Neck: Full, active range of motion is observed.  Cardiovascular: Small, pulsatile venous bleeding noted from the left lateral fifth digit. Respiratory:  Respirations even and unlabored.. Musculoskeletal: Full, active range of motion throughout, specifically of the left hand. Neurologic: Two point discrimination is intact. Skin:  Skin avulsions to the fingertips of the left long and ring fingers. Skin avulsion to the lateral aspect of the pinky finger without injury to the nail.  ____________________________________________   LABS (all labs ordered are listed, but only abnormal results are displayed)  Labs Reviewed - No data to display ____________________________________________  EKG  Not  indicated. ____________________________________________  RADIOLOGY  Not indicated. ____________________________________________   PROCEDURES  Procedure(s) performed: Left hand soaked in normal saline. Successful hemostasis of the pulsatile venous bleeding with tourniquet applied to the pinky finger below the DIP. Fingertip  then exsanguinated and Dermabond was applied under Surgicel. Bulky light compression dressing then applied to the finger and hand was elevated and monitored for return of bleeding. ____________________________________________   INITIAL IMPRESSION / ASSESSMENT AND PLAN / ED COURSE  Mary Heath is a 46 y.o. female who presents to the emergency department for treatment of skin avulsions to the left hand while using a meat slicer at work. Hemostasis was achieved and the wounds were cleaned and dressed.   After approximately 30 minutes, the patient was discharged home. Did dressing that had been applied after the wounds were repaired was clean and dry and there was no further active bleeding.   Pertinent labs & imaging results that were available during my care of the patient were reviewed by me and considered in my medical decision making (see chart for details). ____________________________________________   FINAL CLINICAL IMPRESSION(S) / ED DIAGNOSES  Final diagnoses:  Avulsion of skin of finger, initial encounter    Discharge Medication List as of 12/22/2016  6:16 PM      If controlled substance prescribed during this visit, 12 month history viewed on the [facility] prior to issuing an initial prescription for Schedule II or III opiod.   Note:  This document was prepared using Dragon voice recognition software and may include unintentional dictation errors.    Chinita Pesterriplett, Katria Botts B, FNP 12/22/16 1850    Nita SickleVeronese, [place], MD 12/22/16 [phone]

## 2017-09-10 ENCOUNTER — Encounter (HOSPITAL_COMMUNITY): Payer: Self-pay | Admitting: Emergency Medicine

## 2017-09-10 ENCOUNTER — Emergency Department (HOSPITAL_COMMUNITY)
Admission: EM | Admit: 2017-09-10 | Discharge: 2017-09-10 | Discharge status: Against Medical Advice | Payer: No Typology Code available for payment source | Attending: Emergency Medicine | Admitting: Emergency Medicine

## 2017-09-10 DIAGNOSIS — Z5321 Procedure and treatment not carried out due to patient leaving prior to being seen by health care provider: Secondary | ICD-10-CM | POA: Insufficient documentation

## 2017-09-10 NOTE — ED Notes (Signed)
Unable to collect labs I called patient name in the lobby and no one responded 

## 2017-09-10 NOTE — ED Triage Notes (Signed)
Pt c/o mid abd pains that been intermittent for 2 weeks. Pt adds vomiting with abd pains. C/o blister on lip that came up last night

## 2017-09-10 NOTE — ED Notes (Signed)
CALLED PT FOR LABS FROM LOBBY 2ND ATTEMPT NO RESPONSE

## 2017-09-11 ENCOUNTER — Other Ambulatory Visit: Payer: Self-pay

## 2017-09-11 ENCOUNTER — Encounter (HOSPITAL_COMMUNITY): Payer: Self-pay | Admitting: Emergency Medicine

## 2017-09-11 ENCOUNTER — Emergency Department (HOSPITAL_COMMUNITY)
Admission: EM | Admit: 2017-09-11 | Discharge: 2017-09-11 | Disposition: A | Discharge status: Home / Self Care | Payer: No Typology Code available for payment source | Attending: Emergency Medicine | Admitting: Emergency Medicine

## 2017-09-11 DIAGNOSIS — Z79899 Other long term (current) drug therapy: Secondary | ICD-10-CM | POA: Insufficient documentation

## 2017-09-11 DIAGNOSIS — K219 Gastro-esophageal reflux disease without esophagitis: Secondary | ICD-10-CM

## 2017-09-11 DIAGNOSIS — R1013 Epigastric pain: Secondary | ICD-10-CM

## 2017-09-11 DIAGNOSIS — B001 Herpesviral vesicular dermatitis: Secondary | ICD-10-CM

## 2017-09-11 HISTORY — DX: Umbilical hernia without obstruction or gangrene: K42.9

## 2017-09-11 LAB — URINALYSIS, ROUTINE W REFLEX MICROSCOPIC
Bilirubin Urine: NEGATIVE
GLUCOSE, UA: NEGATIVE mg/dL
Ketones, ur: NEGATIVE mg/dL
Leukocytes, UA: NEGATIVE
Nitrite: NEGATIVE
PROTEIN: NEGATIVE mg/dL
Specific Gravity, Urine: 1.02 (ref 1.005–1.030)
pH: 5 (ref 5.0–8.0)

## 2017-09-11 LAB — CBC
HCT: 37.3 % (ref 36.0–46.0)
Hemoglobin: 12.3 g/dL (ref 12.0–15.0)
MCH: 30.1 pg (ref 26.0–34.0)
MCHC: 33 g/dL (ref 30.0–36.0)
MCV: 91.4 fL (ref 78.0–100.0)
Platelets: 266 10*3/uL (ref 150–400)
RBC: 4.08 MIL/uL (ref 3.87–5.11)
RDW: 13.9 % (ref 11.5–15.5)
WBC: 5.6 10*3/uL (ref 4.0–10.5)

## 2017-09-11 LAB — COMPREHENSIVE METABOLIC PANEL
ALBUMIN: 3.6 g/dL (ref 3.5–5.0)
ALK PHOS: 61 U/L (ref 38–126)
ALT: 16 U/L (ref 14–54)
ANION GAP: 9 (ref 5–15)
AST: 16 U/L (ref 15–41)
BUN: 13 mg/dL (ref 6–20)
CALCIUM: 9.1 mg/dL (ref 8.9–10.3)
CHLORIDE: 109 mmol/L (ref 101–111)
CO2: 24 mmol/L (ref 22–32)
Creatinine, Ser: 0.65 mg/dL (ref 0.44–1.00)
GFR calc Af Amer: >60 mL/min (ref >60)
GFR calc non Af Amer: >60 mL/min (ref >60)
GLUCOSE: 107 mg/dL — AB (ref 65–99)
Potassium: 3.8 mmol/L (ref 3.5–5.1)
SODIUM: 142 mmol/L (ref 135–145)
Total Bilirubin: 0.6 mg/dL (ref 0.3–1.2)
Total Protein: 7.8 g/dL (ref 6.5–8.1)

## 2017-09-11 LAB — LIPASE, BLOOD: Lipase: 26 U/L (ref 11–51)

## 2017-09-11 MED ORDER — OMEPRAZOLE 20 MG PO CPDR: 20.0000 mg | DELAYED_RELEASE_CAPSULE | Freq: Every day | ORAL | 0 refills | Status: DC

## 2017-09-11 MED ORDER — ACYCLOVIR 400 MG PO TABS: 400.0000 mg | ORAL_TABLET | Freq: Three times a day (TID) | ORAL | 0 refills | Status: DC

## 2017-09-11 NOTE — ED Triage Notes (Signed)
Patient presents with umbilical abdominal pain for 2 weeks. Patient states N/V yesterday but none today. Patient reports constipation, last BM x2 days ago. Patient endorses hx of umbilical hernia.

## 2017-09-11 NOTE — Discharge Instructions (Signed)
1.  Try following the diet for reflux.  Take Prilosec daily for 2 weeks. 2.  Schedule a follow-up with a family doctor.  You must have a recheck to make sure your symptoms are improving or going away.  You may need other testing if you are still having ongoing pain. 3.  Return to the emergency department if your symptoms worsen, change or do not improve with treatment.

## 2017-09-11 NOTE — ED Provider Notes (Signed)
[facility]-EMERGENCY DEPT Provider Note   CSN: [id number] Arrival date & time: 09/11/17  0509     History   Chief Complaint Chief Complaint  Patient presents with  . Abdominal Pain    HPI Mary Heath is a 46 y.o. female.  HPI Patient states she has been having abdominal pain for about 2 weeks.  The week before this week, she had vomiting and diarrheal illness for couple of days.  She reports that that gradually resolved.  She states symptoms actually seem to be improving.  She reports she does however continue to have pain in her upper and central abdomen.  It is made worse by food.  She particularly thinks fatty foods make it worse.  She reports 1 of her concerns was to make sure that her hernia was not a problem.  She is no longer having vomiting or diarrhea.  No fevers or chills.  No pain burning urgency with urination.  No vaginal discharge or bleeding.  Patient denies sexual activity.  She reports she thinks things are actually getting better at this point.  She denies having pain at this time.  She reports she also wanted to get a ulcer checked on her lip.  She states she had oral herpes before and taken acyclovir. Past Medical History:  Diagnosis Date  . Depression   . HSV-2 infection   . Hyperlipidemia   . Umbilical hernia   . Varicose veins of both lower extremities     Patient Active Problem List   Diagnosis Date Noted  . DEPRESSION 01/14/2010  . BACK PAIN 01/14/2010  . HSV 06/20/2009  . HYPERCHOLESTEROLEMIA 06/19/2009  . VARICOSE VEINS, LOWER EXTREMITIES 06/19/2009  . TINGLING 06/19/2009  . LEG PAIN, LEFT 06/06/2009  . 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 ONYCHOMYCOSIS 12/05/2007  . OBESITY 12/05/2007  . UMBILICAL HERNIA 12/05/2007    Past Surgical History:  Procedure Laterality Date  . TUBAL LIGATION       OB History    Gravida  5   Para  5   Term  5   Preterm      AB      Living  5     SAB      TAB      Ectopic       Multiple      Live Births               Home Medications    Prior to Admission medications   Medication Sig Start Date End Date Taking? Authorizing Provider  acyclovir (ZOVIRAX) 400 MG tablet Take 1 tablet (400 mg total) by mouth 3 (three) times daily. 09/11/17   Arby Barrette, MD  ibuprofen (ADVIL,MOTRIN) 800 MG tablet Take 1 tablet (800 mg total) by mouth every 8 (eight) hours as needed. 07/03/16   Lawyer, Cristal Deer, PA-C  meloxicam (MOBIC) 7.5 MG tablet Take 2 tablets (15 mg total) by mouth daily. Patient not taking: Reported on 07/03/2016 11/14/13   Lurene Shadow, PA-C  omeprazole (PRILOSEC) 20 MG capsule Take 1 capsule (20 mg total) by mouth daily. 09/11/17   Arby Barrette, MD  traMADol (ULTRAM) 50 MG tablet Take 1 tablet (50 mg total) by mouth every 6 (six) hours as needed. Patient not taking: Reported on 07/03/2016 11/14/13   Rolla Plate    Family History History reviewed. No pertinent family history.  Social History Social History   Tobacco Use  . Smoking status: Never Smoker  .  Smokeless tobacco: Never Used  Substance Use Topics  . Alcohol use: No  . Drug use: No     Allergies   Patient has no known allergies.   Review of Systems Review of Systems 10 Systems reviewed and are negative for acute change except as noted in the HPI.   Physical Exam Updated Vital Signs BP (!) 157/94 (BP Location: Left Arm)   Pulse 81   Temp 98 F (36.7 C) (Oral)   Resp 16   Ht 5' 2.5" (1.588 m)   Wt 117.4 kg (258 lb 12.8 oz)   SpO2 98%   BMI 46.58 kg/m   Physical Exam  Constitutional: She is oriented to person, place, and time. She appears well-developed and well-nourished.  HENT:  Head: Normocephalic and atraumatic.  Patient has a 2 mm, shallow ulcer on her lower lip at the mucosal border.  His membranes otherwise normal.  Pink moist posterior oropharynx widely patent.  Eyes: Pupils are equal, round, and reactive to light. EOM are normal.  Neck: Neck  supple.  Cardiovascular: Normal rate, regular rhythm, normal heart sounds and intact distal pulses.  Pulmonary/Chest: Effort normal and breath sounds normal.  Abdominal: Soft. Bowel sounds are normal. She exhibits no distension. There is no tenderness.  Abdomen is soft and nontender.  Patient easily reproducible umbilical hernia that is nontender.  No guarding.  Musculoskeletal: Normal range of motion. She exhibits no edema or tenderness.  Neurological: She is alert and oriented to person, place, and time. She has normal strength. No cranial nerve deficit. She exhibits normal muscle tone. Coordination normal. GCS eye subscore is 4. GCS verbal subscore is 5. GCS motor subscore is 6.  Skin: Skin is warm, dry and intact.  Psychiatric: She has a normal mood and affect.     ED Treatments / Results  Labs (all labs ordered are listed, but only abnormal results are displayed) Labs Reviewed  COMPREHENSIVE METABOLIC PANEL - Abnormal; Notable for the following components:      Result Value   Glucose, Bld 107 (*)    All other components within normal limits  URINALYSIS, ROUTINE W REFLEX MICROSCOPIC - Abnormal; Notable for the following components:   Hgb urine dipstick MODERATE (*)    Bacteria, UA RARE (*)    Squamous Epithelial / LPF 0-5 (*)    All other components within normal limits  LIPASE, BLOOD  CBC    EKG None  Radiology No results found.  Procedures Procedures (including critical care time)  Medications Ordered in ED Medications - No data to display   Initial Impression / Assessment and Plan / ED Course  I have reviewed the triage vital signs and the nursing notes.  Pertinent labs & imaging results that were available during my care of the patient were reviewed by me and considered in my medical decision making (see chart for details).      Final Clinical Impressions(s) / ED Diagnoses   Final diagnoses:  Epigastric pain  Gastroesophageal reflux disease, esophagitis  presence not specified  Herpes labialis without complication  Patient is clinically well in appearance.  She does not have abdominal pain at this time.  Labs are within normal limits.  Patient did have right upper quadrant ultrasound last year that did not show stones or thickening.  At this time, with symptoms off may be triggered by food, will have patient trial Prilosec with reflux diet.  Return precautions reviewed.  Patient made aware of necessity for follow-up to make her symptoms  are resolved with conservative treatment.  She is made aware that other diagnostic studies may be needed if symptoms persist or worsen.  ED Discharge Orders        Ordered    omeprazole (PRILOSEC) 20 MG capsule  Daily     09/11/17 1441    acyclovir (ZOVIRAX) 400 MG tablet  3 times daily     09/11/17 1447       Arby BarrettePfeiffer, Joseguadalupe Stan, MD 09/11/17 1457

## 2017-10-04 ENCOUNTER — Encounter (HOSPITAL_COMMUNITY): Payer: Self-pay | Admitting: Emergency Medicine

## 2017-10-04 ENCOUNTER — Emergency Department (HOSPITAL_COMMUNITY)
Admission: EM | Admit: 2017-10-04 | Discharge: 2017-10-04 | Disposition: A | Discharge status: Home / Self Care | Payer: No Typology Code available for payment source | Attending: Emergency Medicine | Admitting: Emergency Medicine

## 2017-10-04 DIAGNOSIS — R1013 Epigastric pain: Secondary | ICD-10-CM

## 2017-10-04 DIAGNOSIS — Z79899 Other long term (current) drug therapy: Secondary | ICD-10-CM | POA: Insufficient documentation

## 2017-10-04 LAB — COMPREHENSIVE METABOLIC PANEL
ALT: 20 U/L (ref 14–54)
ANION GAP: 9 (ref 5–15)
AST: 20 U/L (ref 15–41)
Albumin: 3.4 g/dL — ABNORMAL LOW (ref 3.5–5.0)
Alkaline Phosphatase: 57 U/L (ref 38–126)
BUN: 9 mg/dL (ref 6–20)
CO2: 24 mmol/L (ref 22–32)
Calcium: 9.2 mg/dL (ref 8.9–10.3)
Chloride: 108 mmol/L (ref 101–111)
Creatinine, Ser: 0.65 mg/dL (ref 0.44–1.00)
Glucose, Bld: 105 mg/dL — ABNORMAL HIGH (ref 65–99)
Potassium: 3.9 mmol/L (ref 3.5–5.1)
SODIUM: 141 mmol/L (ref 135–145)
Total Bilirubin: 0.6 mg/dL (ref 0.3–1.2)
Total Protein: 7.3 g/dL (ref 6.5–8.1)

## 2017-10-04 LAB — I-STAT BETA HCG BLOOD, ED (MC, WL, AP ONLY)

## 2017-10-04 LAB — CBC
HCT: 37.9 % (ref 36.0–46.0)
HEMOGLOBIN: 12.6 g/dL (ref 12.0–15.0)
MCH: 29.9 pg (ref 26.0–34.0)
MCHC: 33.2 g/dL (ref 30.0–36.0)
MCV: 90 fL (ref 78.0–100.0)
PLATELETS: 243 10*3/uL (ref 150–400)
RBC: 4.21 MIL/uL (ref 3.87–5.11)
RDW: 14 % (ref 11.5–15.5)
WBC: 6.6 10*3/uL (ref 4.0–10.5)

## 2017-10-04 LAB — URINALYSIS, ROUTINE W REFLEX MICROSCOPIC
Bilirubin Urine: NEGATIVE
Glucose, UA: NEGATIVE mg/dL
Hgb urine dipstick: NEGATIVE
Ketones, ur: NEGATIVE mg/dL
LEUKOCYTES UA: NEGATIVE
NITRITE: NEGATIVE
Protein, ur: NEGATIVE mg/dL
SPECIFIC GRAVITY, URINE: 1.019 (ref 1.005–1.030)
pH: 5 (ref 5.0–8.0)

## 2017-10-04 LAB — LIPASE, BLOOD: Lipase: 25 U/L (ref 11–51)

## 2017-10-04 MED ORDER — BISACODYL 5 MG PO TBEC: 5.0000 mg | DELAYED_RELEASE_TABLET | Freq: Every day | ORAL | 0 refills | Status: DC | PRN

## 2017-10-04 MED ORDER — ACYCLOVIR 400 MG PO TABS: 400.0000 mg | ORAL_TABLET | Freq: Three times a day (TID) | ORAL | 0 refills | Status: DC

## 2017-10-04 NOTE — ED Provider Notes (Signed)
[facility] EMERGENCY DEPARTMENT Provider Note   CSN: [id number] Arrival date & time: 10/04/17  0515     History   Chief Complaint Chief Complaint  Patient presents with  . Abdominal Pain      HPI Patient is a 47 year old female with history of HSV who presents with abdominal pain.  Patient reports that she has had pain for the past 2 days.  While she was waiting to be seen, pain resolved.  She is currently pain-free.  She has had similar pain in the past, usually when she is constipated.  She has been having some issues with constipation lately.  Last BM was earlier today, reported as smaller than usual.  She denies any nausea, vomiting, fevers or chills.  She denies any urinary complaints or vaginal discharge.  She is still been able to tolerate p.o. without difficulty.  Past Medical History:  Diagnosis Date  . Depression   . HSV-2 infection   . Hyperlipidemia   . Umbilical hernia   . Varicose veins of both lower extremities     Patient Active Problem List   Diagnosis Date Noted  . DEPRESSION 01/14/2010  . BACK PAIN 01/14/2010  . HSV 06/20/2009  . HYPERCHOLESTEROLEMIA 06/19/2009  . VARICOSE VEINS, LOWER EXTREMITIES 06/19/2009  . TINGLING 06/19/2009  . LEG PAIN, LEFT 06/06/2009  . 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 ONYCHOMYCOSIS 12/05/2007  . OBESITY 12/05/2007  . UMBILICAL HERNIA 12/05/2007    Past Surgical History:  Procedure Laterality Date  . TUBAL LIGATION       OB History    Gravida  5   Para  5   Term  5   Preterm      AB      Living  5     SAB      TAB      Ectopic      Multiple      Live Births               Home Medications    Prior to Admission medications   Medication Sig Start Date End Date Taking? Authorizing Provider  acyclovir (ZOVIRAX) 400 MG tablet Take 1 tablet (400 mg total) by mouth 3 (three) times daily. 10/04/17   Wynelle Cleveland, MD  bisacodyl (DULCOLAX) 5 MG EC tablet Take 1 tablet (5 mg  total) by mouth daily as needed for moderate constipation. 10/04/17   Wynelle Cleveland, MD  ibuprofen (ADVIL,MOTRIN) 800 MG tablet Take 1 tablet (800 mg total) by mouth every 8 (eight) hours as needed. 07/03/16   Lawyer, Cristal Deer, PA-C  meloxicam (MOBIC) 7.5 MG tablet Take 2 tablets (15 mg total) by mouth daily. Patient not taking: Reported on 07/03/2016 11/14/13   Lurene Shadow, PA-C  omeprazole (PRILOSEC) 20 MG capsule Take 1 capsule (20 mg total) by mouth daily. 09/11/17   Arby Barrette, MD  traMADol (ULTRAM) 50 MG tablet Take 1 tablet (50 mg total) by mouth every 6 (six) hours as needed. Patient not taking: Reported on 07/03/2016 11/14/13   Rolla Plate    Family History No family history on file.  Social History Social History   Tobacco Use  . Smoking status: Never Smoker  . Smokeless tobacco: Never Used  Substance Use Topics  . Alcohol use: No  . Drug use: No     Allergies   Patient has no known allergies.   Review of Systems Review of Systems  Constitutional: Negative for chills and fever.  HENT: Negative for ear pain and sore throat.   Eyes: Negative for pain and visual disturbance.  Respiratory: Negative for cough and shortness of breath.   Cardiovascular: Negative for chest pain and palpitations.  Gastrointestinal: Positive for abdominal pain and constipation. Negative for vomiting.  Genitourinary: Negative for dysuria and hematuria.  Musculoskeletal: Negative for arthralgias and back pain.  Skin: Negative for color change and rash.  Neurological: Negative for seizures and syncope.  All other systems reviewed and are negative.    Physical Exam Updated Vital Signs BP 137/83   Pulse 75   Temp 98 F (36.7 C) (Oral)   Resp 16   Ht  (1.6 m)   Wt 112 kg (247 lb)   SpO2 98%   BMI 43.75 kg/m   Physical Exam  Constitutional: She appears well-developed and well-nourished. No distress.  HENT:  Head: Normocephalic and atraumatic.  Eyes: Conjunctivae  are normal.  Neck: Neck supple.  Cardiovascular: Normal rate and regular rhythm.  No murmur heard. Pulmonary/Chest: Effort normal and breath sounds normal. No respiratory distress.  Abdominal: Soft. There is no tenderness.  Musculoskeletal: She exhibits no edema.  Neurological: She is alert.  Skin: Skin is warm and dry.  Psychiatric: She has a normal mood and affect.  Nursing note and vitals reviewed.    ED Treatments / Results  Labs (all labs ordered are listed, but only abnormal results are displayed) Labs Reviewed  COMPREHENSIVE METABOLIC PANEL - Abnormal; Notable for the following components:      Result Value   Glucose, Bld 105 (*)    Albumin 3.4 (*)    All other components within normal limits  URINALYSIS, ROUTINE W REFLEX MICROSCOPIC - Abnormal; Notable for the following components:   APPearance HAZY (*)    All other components within normal limits  LIPASE, BLOOD  CBC  I-STAT BETA HCG BLOOD, ED ([facility], [facility], [facility] ONLY)    EKG None  Radiology No results found.  Procedures Procedures (including critical care time)  Medications Ordered in ED Medications - No data to display   Initial Impression / Assessment and Plan / ED Course  I have reviewed the triage vital signs and the nursing notes.  Pertinent labs & imaging results that were available during my care of the patient were reviewed by me and considered in my medical decision making (see chart for details).    Patient is a 47 year old female with history of HSV who presents with abdominal pain.  Patient arrived hemodynamically stable, no acute distress.  Exam as above, significant for benign abdominal examination.  No rebound or guarding.  Complete resolution of symptoms while waiting in the emergency department.  Labs obtained, all stable from previous.  No evidence of acute abdominal process at this time.  No indications for further imaging.  Will provide treatment for constipation.  Patient also requesting  refill of acyclovir for herpes, which I have provided.  Follow-up instructions to obtain a PCM were given.  Patient and plan of care discussed with Attending physician, Dr. Denton Lank.    Final Clinical Impressions(s) / ED Diagnoses   Final diagnoses:  Epigastric pain    ED Discharge Orders        Ordered    acyclovir (ZOVIRAX) 400 MG tablet  3 times daily     10/04/17 1124    bisacodyl (DULCOLAX) 5 MG EC tablet  Daily PRN     10/04/17 1124       Wynelle Cleveland, MD 10/04/17 1704  Cathren Laine, MD 10/05/17 [phone]

## 2017-10-04 NOTE — ED Triage Notes (Signed)
Reports pain around umbilicus that started yesterday.  Hx of the same due to hernia.  Denies any n/v.

## 2017-11-17 ENCOUNTER — Emergency Department (HOSPITAL_COMMUNITY)
Admission: EM | Admit: 2017-11-17 | Discharge: 2017-11-17 | Disposition: A | Discharge status: Home / Self Care | Payer: No Typology Code available for payment source | Attending: Emergency Medicine | Admitting: Emergency Medicine

## 2017-11-17 ENCOUNTER — Other Ambulatory Visit: Payer: Self-pay

## 2017-11-17 ENCOUNTER — Encounter (HOSPITAL_COMMUNITY): Payer: Self-pay | Admitting: Emergency Medicine

## 2017-11-17 DIAGNOSIS — M79671 Pain in right foot: Secondary | ICD-10-CM | POA: Insufficient documentation

## 2017-11-17 DIAGNOSIS — X509XXA Other and unspecified overexertion or strenuous movements or postures, initial encounter: Secondary | ICD-10-CM | POA: Insufficient documentation

## 2017-11-17 DIAGNOSIS — Y939 Activity, unspecified: Secondary | ICD-10-CM | POA: Insufficient documentation

## 2017-11-17 DIAGNOSIS — Y929 Unspecified place or not applicable: Secondary | ICD-10-CM | POA: Insufficient documentation

## 2017-11-17 DIAGNOSIS — Z79899 Other long term (current) drug therapy: Secondary | ICD-10-CM | POA: Insufficient documentation

## 2017-11-17 DIAGNOSIS — Z760 Encounter for issue of repeat prescription: Secondary | ICD-10-CM | POA: Insufficient documentation

## 2017-11-17 DIAGNOSIS — Y999 Unspecified external cause status: Secondary | ICD-10-CM | POA: Insufficient documentation

## 2017-11-17 MED ORDER — ACYCLOVIR 400 MG PO TABS: 400.0000 mg | ORAL_TABLET | Freq: Three times a day (TID) | ORAL | 0 refills | Status: DC

## 2017-11-17 NOTE — ED Triage Notes (Signed)
Pt states she has a "hard place" on the bottom of her right foot.  States it "softens up" sometimes and gets better but then returns and bothers her.

## 2017-11-17 NOTE — ED Provider Notes (Signed)
[facility] EMERGENCY DEPARTMENT Provider Note   CSN: [id number] Arrival date & time: 6/[facility]26/19  [id number]     History   Chief Complaint Chief Complaint  Patient presents with  . Foot Pain    HPI Mary Heath is a 47 y.o. female.  HPI  47 year old female comes in with chief complaint of foot pain.  She has history of hyperlipidemia and states that she has been having foot pain for the last several months.  Patient states that her work requires her to stand up for 8 straight hours, and she frequently has severe pain during work.  Pain is located at the plantar surface of both of her legs, right worse than left.  There is no trauma.  Patient does not have any history of diabetes and she does not smoke heavily.  Past Medical History:  Diagnosis Date  . Depression   . HSV-2 infection   . Hyperlipidemia   . Umbilical hernia   . Varicose veins of both lower extremities     Patient Active Problem List   Diagnosis Date Noted  . DEPRESSION 01/14/2010  . BACK PAIN 01/14/2010  . HSV 06/20/2009  . HYPERCHOLESTEROLEMIA 06/19/2009  . VARICOSE VEINS, LOWER EXTREMITIES 06/19/2009  . TINGLING 06/19/2009  . LEG PAIN, LEFT 06/06/2009  . 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 ONYCHOMYCOSIS 12/05/2007  . OBESITY 12/05/2007  . UMBILICAL HERNIA 12/05/2007    Past Surgical History:  Procedure Laterality Date  . TUBAL LIGATION       OB History    Gravida  5   Para  5   Term  5   Preterm      AB      Living  5     SAB      TAB      Ectopic      Multiple      Live Births               Home Medications    Prior to Admission medications   Medication Sig Start Date End Date Taking? Authorizing Provider  acyclovir (ZOVIRAX) 400 MG tablet Take 1 tablet (400 mg total) by mouth 3 (three) times daily. 11/17/17   Derwood KaplanNanavati, Shereda Graw, MD  bisacodyl (DULCOLAX) 5 MG EC tablet Take 1 tablet (5 mg total) by mouth daily as needed for moderate constipation.  10/04/17   Wynelle ClevelandMizera, Kathryn, MD  ibuprofen (ADVIL,MOTRIN) 800 MG tablet Take 1 tablet (800 mg total) by mouth every 8 (eight) hours as needed. 07/03/16   Lawyer, Cristal Deerhristopher, PA-C  meloxicam (MOBIC) 7.5 MG tablet Take 2 tablets (15 mg total) by mouth daily. Patient not taking: Reported on 07/03/2016 11/14/13   Lurene ShadowPhelps, Erin O, PA-C  omeprazole (PRILOSEC) 20 MG capsule Take 1 capsule (20 mg total) by mouth daily. 09/11/17   Arby BarrettePfeiffer, Marcy, MD  traMADol (ULTRAM) 50 MG tablet Take 1 tablet (50 mg total) by mouth every 6 (six) hours as needed. Patient not taking: Reported on 07/03/2016 11/14/13   Rolla PlatePhelps, Erin O, PA-C    Family History No family history on file.  Social History Social History   Tobacco Use  . Smoking status: Never Smoker  . Smokeless tobacco: Never Used  Substance Use Topics  . Alcohol use: No  . Drug use: No     Allergies   Patient has no known allergies.   Review of Systems Review of Systems  Musculoskeletal: Positive for myalgias.  Allergic/Immunologic: Negative for immunocompromised state.  Hematological: Does not  bruise/bleed easily.     Physical Exam Updated Vital Signs BP 127/74   Pulse 77   Temp 97.7 F (36.5 C) (Oral)   Resp 16   SpO2 100%   Physical Exam  Constitutional: She is oriented to person, place, and time. She appears well-developed.  HENT:  Head: Normocephalic and atraumatic.  Eyes: EOM are normal.  Neck: Normal range of motion. Neck supple.  Cardiovascular: Normal rate.  Pulmonary/Chest: Effort normal.  Abdominal: Bowel sounds are normal.  Musculoskeletal:  Patient does not have any evidence of palpable nodule, no focal tenderness, no skin breakdown -extensive scaling of the plantar surface noted  Neurological: She is alert and oriented to person, place, and time.  Skin: Skin is warm and dry.  Nursing note and vitals reviewed.    ED Treatments / Results  Labs (all labs ordered are listed, but only abnormal results are  displayed) Labs Reviewed - No data to display  EKG None  Radiology No results found.  Procedures Procedures (including critical care time)  Medications Ordered in ED Medications - No data to display   Initial Impression / Assessment and Plan / ED Course  I have reviewed the triage vital signs and the nursing notes.  Pertinent labs & imaging results that were available during my care of the patient were reviewed by me and considered in my medical decision making (see chart for details).     47 year old female comes in with chief complaint of foot pain.  On exam there is no wart, callus, cyst.  Patient also does not have any signs of fracture.  She does not have diabetes and therefore we do not think she has Charcot's foot.  We will advised patient to see podiatrist.  Conservative measures until she is seen.  Final Clinical Impressions(s) / ED Diagnoses   Final diagnoses:  Foot pain, right  Medication refill    ED Discharge Orders        Ordered    acyclovir (ZOVIRAX) 400 MG tablet  3 times daily     11/17/17 1610       Derwood Kaplan, MD 11/17/17 0710

## 2017-11-17 NOTE — Discharge Instructions (Signed)
Please see the podiatrist as requested if the pain persist. Attempt warm soaks before and after your work to see if that helps.  As discussed, you might benefit by visiting a special shoe store to get fitted with appropriate shoes.

## 2018-05-10 ENCOUNTER — Emergency Department (HOSPITAL_COMMUNITY)
Admission: EM | Admit: 2018-05-10 | Discharge: 2018-05-10 | Disposition: A | Discharge status: Home / Self Care | Payer: No Typology Code available for payment source | Attending: Emergency Medicine | Admitting: Emergency Medicine

## 2018-05-10 ENCOUNTER — Encounter (HOSPITAL_COMMUNITY): Payer: Self-pay

## 2018-05-10 DIAGNOSIS — Z79899 Other long term (current) drug therapy: Secondary | ICD-10-CM | POA: Insufficient documentation

## 2018-05-10 DIAGNOSIS — F329 Major depressive disorder, single episode, unspecified: Secondary | ICD-10-CM | POA: Insufficient documentation

## 2018-05-10 DIAGNOSIS — R221 Localized swelling, mass and lump, neck: Secondary | ICD-10-CM

## 2018-05-10 MED ORDER — KETOROLAC TROMETHAMINE 30 MG/ML IJ SOLN
15.0000 mg | Freq: Once | INTRAMUSCULAR | Status: AC
  Administered 2018-05-10: 15 mg via INTRAMUSCULAR
  Filled 2018-05-10: qty 1

## 2018-05-10 NOTE — ED Triage Notes (Signed)
Pt c/o sore throat with "lumps on her neck" since Thursday after a recent illness.

## 2018-05-10 NOTE — ED Provider Notes (Signed)
[facility] Provider Note   CSN: [id number] Arrival date & time: 05/10/18  1122     History   Chief Complaint Chief Complaint  Patient presents with  . Sore Throat    HPI Mary Heath is a 47 y.o. female.  HPI Patient presents with concern of palpable nodules on her throat as well as fatigue, xerostomia. Onset was 4-5 days ago, since that time have been persistent, with worsening fatigue, without fever, without nausea, vomiting, without other pain. The palpable nodules are sore, with a full sensation, but no difficulty breathing, speaking. She has not seen her physician peer She states that she is generally well aside from history of recurrent herpes outbreaks, none currently. Her physician recently stopped practicing, and she currently has no primary care.  Past Medical History:  Diagnosis Date  . Depression   . HSV-2 infection   . Hyperlipidemia   . Umbilical hernia   . Varicose veins of both lower extremities     Patient Active Problem List   Diagnosis Date Noted  . DEPRESSION 01/14/2010  . BACK PAIN 01/14/2010  . HSV 06/20/2009  . HYPERCHOLESTEROLEMIA 06/19/2009  . VARICOSE VEINS, LOWER EXTREMITIES 06/19/2009  . TINGLING 06/19/2009  . LEG PAIN, LEFT 06/06/2009  . 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 ONYCHOMYCOSIS 12/05/2007  . OBESITY 12/05/2007  . UMBILICAL HERNIA 12/05/2007    Past Surgical History:  Procedure Laterality Date  . TUBAL LIGATION       OB History    Gravida  5   Para  5   Term  5   Preterm      AB      Living  5     SAB      TAB      Ectopic      Multiple      Live Births               Home Medications    Prior to Admission medications   Medication Sig Start Date End Date Taking? Authorizing Provider  acyclovir (ZOVIRAX) 400 MG tablet Take 1 tablet (400 mg total) by mouth 3 (three) times daily. 11/17/17   Derwood KaplanNanavati, Ankit, MD  bisacodyl (DULCOLAX) 5 MG EC tablet  Take 1 tablet (5 mg total) by mouth daily as needed for moderate constipation. 10/04/17   Wynelle ClevelandMizera, Kathryn, MD  ibuprofen (ADVIL,MOTRIN) 800 MG tablet Take 1 tablet (800 mg total) by mouth every 8 (eight) hours as needed. 07/03/16   Lawyer, Cristal Deerhristopher, PA-C  meloxicam (MOBIC) 7.5 MG tablet Take 2 tablets (15 mg total) by mouth daily. Patient not taking: Reported on 07/03/2016 11/14/13   Lurene ShadowPhelps, Erin O, PA-C  omeprazole (PRILOSEC) 20 MG capsule Take 1 capsule (20 mg total) by mouth daily. 09/11/17   Arby BarrettePfeiffer, Marcy, MD  traMADol (ULTRAM) 50 MG tablet Take 1 tablet (50 mg total) by mouth every 6 (six) hours as needed. Patient not taking: Reported on 07/03/2016 11/14/13   Rolla PlatePhelps, Erin O, PA-C    Family History History reviewed. No pertinent family history.  Social History Social History   Tobacco Use  . Smoking status: Never Smoker  . Smokeless tobacco: Never Used  Substance Use Topics  . Alcohol use: No  . Drug use: No     Allergies   Patient has no known allergies.   Review of Systems Review of Systems  Constitutional:       Per HPI, otherwise negative  HENT:  Per HPI, otherwise negative  Respiratory:       Per HPI, otherwise negative  Cardiovascular:       Per HPI, otherwise negative  Gastrointestinal: Negative for vomiting.  Endocrine:       Negative aside from HPI  Genitourinary:       Neg aside from HPI   Musculoskeletal:       Per HPI, otherwise negative  Skin: Negative.   Allergic/Immunologic: Negative for immunocompromised state.  Neurological: Negative for syncope.     Physical Exam Updated Vital Signs BP 137/86 (BP Location: Right Arm)   Pulse 95   Temp 98 F (36.7 C) (Oral)   Resp 16   SpO2 98%   Physical Exam Vitals signs and nursing note reviewed.  Constitutional:      General: She is not in acute distress.    Appearance: She is well-developed.  HENT:     Head: Normocephalic and atraumatic.     Comments: Palpable submandibular glands  bilaterally, tender to palpation.  No visible oral pharyngeal lesions. Eyes:     Conjunctiva/sclera: Conjunctivae normal.  Cardiovascular:     Rate and Rhythm: Normal rate and regular rhythm.  Pulmonary:     Effort: Pulmonary effort is normal. No respiratory distress.     Breath sounds: Normal breath sounds. No stridor.  Abdominal:     General: There is no distension.  Skin:    General: Skin is warm and dry.  Neurological:     Mental Status: She is alert and oriented to person, place, and time.     Cranial Nerves: No cranial nerve deficit.      ED Treatments / Results  Labs (all labs ordered are listed, but only abnormal results are displayed) Labs Reviewed  MUMPS ANTIBODY, IGG  MUMPS ANTIBODY, IGM    Procedures Procedures (including critical care time)  Medications Ordered in ED Medications  ketorolac (TORADOL) 30 MG/ML injection 15 mg (15 mg Intramuscular Given 05/10/18 1219)     Initial Impression / Assessment and Plan / ED Course  I have reviewed the triage vital signs and the nursing notes.  Pertinent labs & imaging results that were available during my care of the patient were reviewed by me and considered in my medical decision making (see chart for details).     2:45 PM I discussed the patient's presentation with our infectious disease colleagues. Labs are sent, not likely to be resulted today or tomorrow. On repeat exam the patient is awake and alert, no distress, hemodynamically unremarkable, no evidence for respiratory compromise. With no evidence for bacteremia, sepsis, no evidence for strep throat, with no oropharyngeal exudate, no fever, the patient will be discharged with close outpatient follow-up, pending lab results, which will be made available to her electronically.  Final Clinical Impressions(s) / ED Diagnoses   Final diagnoses:  Throat swelling      Gerhard Munch, MD 05/10/18 [phone]

## 2018-05-10 NOTE — Discharge Instructions (Addendum)
As discussed, today's evaluation has been generally reassuring. However, there are outstanding lab results which will become available in the next 2 or 3 days. Please be sure to check your medical records, electronically. However, you should also be made aware of abnormal findings via telephone. 80 if you develop new, or concerning changes, please return here for further evaluation and management.  Please use ibuprofen, 400 mg, 3 times daily for symptom control.

## 2018-05-11 LAB — MUMPS ANTIBODY, IGG: Mumps IgG: 60.8 AU/mL (ref >10.9)

## 2018-05-11 LAB — MUMPS ANTIBODY, IGM: Mumps IgM: <0.8 AU (ref 0.00–0.79)

## 2018-05-14 ENCOUNTER — Other Ambulatory Visit: Payer: Self-pay

## 2018-05-14 ENCOUNTER — Emergency Department (HOSPITAL_COMMUNITY): Payer: Self-pay

## 2018-05-14 ENCOUNTER — Emergency Department (HOSPITAL_COMMUNITY)
Admission: EM | Admit: 2018-05-14 | Discharge: 2018-05-14 | Disposition: A | Discharge status: Home / Self Care | Payer: Self-pay | Attending: Emergency Medicine | Admitting: Emergency Medicine

## 2018-05-14 ENCOUNTER — Encounter (HOSPITAL_COMMUNITY): Payer: Self-pay | Admitting: Emergency Medicine

## 2018-05-14 DIAGNOSIS — R5381 Other malaise: Secondary | ICD-10-CM | POA: Insufficient documentation

## 2018-05-14 DIAGNOSIS — R682 Dry mouth, unspecified: Secondary | ICD-10-CM | POA: Insufficient documentation

## 2018-05-14 DIAGNOSIS — R05 Cough: Secondary | ICD-10-CM | POA: Insufficient documentation

## 2018-05-14 DIAGNOSIS — Z79899 Other long term (current) drug therapy: Secondary | ICD-10-CM | POA: Insufficient documentation

## 2018-05-14 DIAGNOSIS — R5383 Other fatigue: Secondary | ICD-10-CM | POA: Insufficient documentation

## 2018-05-14 DIAGNOSIS — J029 Acute pharyngitis, unspecified: Secondary | ICD-10-CM | POA: Insufficient documentation

## 2018-05-14 DIAGNOSIS — R630 Anorexia: Secondary | ICD-10-CM | POA: Insufficient documentation

## 2018-05-14 DIAGNOSIS — R509 Fever, unspecified: Secondary | ICD-10-CM | POA: Insufficient documentation

## 2018-05-14 DIAGNOSIS — R590 Localized enlarged lymph nodes: Secondary | ICD-10-CM | POA: Insufficient documentation

## 2018-05-14 LAB — MONONUCLEOSIS SCREEN: MONO SCREEN: NEGATIVE

## 2018-05-14 MED ORDER — KETOROLAC TROMETHAMINE 60 MG/2ML IM SOLN
30.0000 mg | Freq: Once | INTRAMUSCULAR | Status: AC
  Administered 2018-05-14: 30 mg via INTRAMUSCULAR
  Filled 2018-05-14: qty 2

## 2018-05-14 MED ORDER — MELOXICAM 15 MG PO TABS: 15.0000 mg | ORAL_TABLET | Freq: Every day | ORAL | 0 refills | Status: DC

## 2018-05-14 MED ORDER — TRAMADOL HCL 50 MG PO TABS: 50.0000 mg | ORAL_TABLET | Freq: Four times a day (QID) | ORAL | 0 refills | Status: DC | PRN

## 2018-05-14 MED ORDER — DEXAMETHASONE SODIUM PHOSPHATE 10 MG/ML IJ SOLN
10.0000 mg | Freq: Once | INTRAMUSCULAR | Status: AC
  Administered 2018-05-14: 10 mg via INTRAMUSCULAR
  Filled 2018-05-14: qty 1

## 2018-05-14 NOTE — Discharge Instructions (Addendum)
Keep all of your appointments and follow up as directed. Return to the Emergency Department for the following reasons: You have difficulty breathing. You cannot swallow fluids, soft foods, or your saliva. You have persistent nausea and vomiting.

## 2018-05-14 NOTE — ED Provider Notes (Signed)
[facility] EMERGENCY DEPARTMENT Provider Note   CSN: [id number] Arrival date & time: 05/14/18  4098     History   Chief Complaint Chief Complaint  Patient presents with  . Jaw Pain    HPI Mary Heath is a 47 y.o. female who presents to the ED with cc of cervical lymphadenopathy.  The patient has had 9 days of symptoms including sore throat, dry mouth, marked tonsillar lymphadenopathy and pain, malaise, fatigue, subjective fever, loss of appetite, cough and sore throat.  She was seen 4 days ago for the same complaint and had mumps titers obtained which are negative upon review of EMR.  Patient states that she feels like her lymph nodes have improved.  Patient is a non-smoker.  She denies changes in voice, difficulty swallowing.  HPI  Past Medical History:  Diagnosis Date  . Depression   . HSV-2 infection   . Hyperlipidemia   . Umbilical hernia   . Varicose veins of both lower extremities     Patient Active Problem List   Diagnosis Date Noted  . DEPRESSION 01/14/2010  . BACK PAIN 01/14/2010  . HSV 06/20/2009  . HYPERCHOLESTEROLEMIA 06/19/2009  . VARICOSE VEINS, LOWER EXTREMITIES 06/19/2009  . TINGLING 06/19/2009  . LEG PAIN, LEFT 06/06/2009  . 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 ONYCHOMYCOSIS 12/05/2007  . OBESITY 12/05/2007  . UMBILICAL HERNIA 12/05/2007    Past Surgical History:  Procedure Laterality Date  . TUBAL LIGATION       OB History    Gravida  5   Para  5   Term  5   Preterm      AB      Living  5     SAB      TAB      Ectopic      Multiple      Live Births               Home Medications    Prior to Admission medications   Medication Sig Start Date End Date Taking? Authorizing Provider  acyclovir (ZOVIRAX) 400 MG tablet Take 1 tablet (400 mg total) by mouth 3 (three) times daily. 11/17/17   Derwood Kaplan, MD  bisacodyl (DULCOLAX) 5 MG EC tablet Take 1 tablet (5 mg total) by mouth daily as needed  for moderate constipation. 10/04/17   Wynelle Cleveland, MD  ibuprofen (ADVIL,MOTRIN) 800 MG tablet Take 1 tablet (800 mg total) by mouth every 8 (eight) hours as needed. 07/03/16   Lawyer, Cristal Deer, PA-C  meloxicam (MOBIC) 7.5 MG tablet Take 2 tablets (15 mg total) by mouth daily. Patient not taking: Reported on 07/03/2016 11/14/13   Lurene Shadow, PA-C  omeprazole (PRILOSEC) 20 MG capsule Take 1 capsule (20 mg total) by mouth daily. 09/11/17   Arby Barrette, MD  traMADol (ULTRAM) 50 MG tablet Take 1 tablet (50 mg total) by mouth every 6 (six) hours as needed. Patient not taking: Reported on 07/03/2016 11/14/13   Rolla Plate    Family History No family history on file.  Social History Social History   Tobacco Use  . Smoking status: Never Smoker  . Smokeless tobacco: Never Used  Substance Use Topics  . Alcohol use: No  . Drug use: No     Allergies   Patient has no known allergies.   Review of Systems Review of Systems  Ten systems reviewed and are negative for acute change, except as noted in the HPI.  Physical Exam Updated Vital Signs BP (!) 141/83 (BP Location: Right Arm)   Pulse 77   Temp 98.2 F (36.8 C) (Oral)   Resp 20   Ht 5\' 3"  (1.6 m)   SpO2 98%   BMI 43.75 kg/m   Physical Exam Vitals signs and nursing note reviewed.  Constitutional:      General: She is not in acute distress.    Appearance: She is well-developed. She is not diaphoretic.  HENT:     Head: Normocephalic and atraumatic.     Mouth/Throat:     Mouth: Mucous membranes are moist.     Pharynx: No oropharyngeal exudate or posterior oropharyngeal erythema.  Eyes:     General: No scleral icterus.    Extraocular Movements: Extraocular movements intact.     Conjunctiva/sclera: Conjunctivae normal.     Pupils: Pupils are equal, round, and reactive to light.  Neck:     Musculoskeletal: Normal range of motion.  Cardiovascular:     Rate and Rhythm: Normal rate and regular rhythm.     Heart  sounds: Normal heart sounds. No murmur. No friction rub. No gallop.   Pulmonary:     Effort: Pulmonary effort is normal. No respiratory distress.     Breath sounds: Normal breath sounds.  Abdominal:     General: Bowel sounds are normal. There is no distension.     Palpations: Abdomen is soft. There is no mass.     Tenderness: There is no abdominal tenderness. There is no guarding.  Lymphadenopathy:     Head:     Right side of head: Tonsillar adenopathy present.     Left side of head: Tonsillar adenopathy present. No submental adenopathy.     Cervical: Cervical adenopathy present.     Upper Body:     Right upper body: No supraclavicular, axillary, pectoral or epitrochlear adenopathy.     Left upper body: No supraclavicular, axillary, pectoral or epitrochlear adenopathy.     Comments: Tonsillar lymph nodes are the approximate size of a racquetball BL. Mild anterior cervical adenopathy. No posterior chain cervical adenopathy  Skin:    General: Skin is warm and dry.  Neurological:     Mental Status: She is alert and oriented to person, place, and time.  Psychiatric:        Behavior: Behavior normal.      ED Treatments / Results  Labs (all labs ordered are listed, but only abnormal results are displayed) Labs Reviewed - No data to display  EKG None  Radiology No results found.  Procedures Procedures (including critical care time)  Medications Ordered in ED Medications - No data to display   Initial Impression / Assessment and Plan / ED Course  I have reviewed the triage vital signs and the nursing notes.  Pertinent labs & imaging results that were available during my care of the patient were reviewed by me and considered in my medical decision making (see chart for details).     47 year old female with marked tonsillar lymphadenopathy and anterior cervical chain adenopathy without posterior adenopathy or adenopathy of other regions of her body.  She has a negative IgG  IgM for mumps which was drawn 4 days ago.  Her mononucleosis screen is also negative today.  Patient was given Decadron and Toradol here.  She will be discharged with tramadol and meloxicam for pain control treatment.  She has follow-up with ENT in early January.  I discussed return precautions with the patient appears appropriate for discharge  at this time.  PDMP reviewed during this encounter.   Final Clinical Impressions(s) / ED Diagnoses   Final diagnoses:  Anterior cervical adenopathy    ED Discharge Orders    None       Arthor CaptainHarris, Anzal Bartnick, PA-C 05/14/18 1119    Alvira MondaySchlossman, Erin, MD 05/15/18 [phone]

## 2018-05-14 NOTE — ED Triage Notes (Signed)
Pt. Stated, I was dx with the mumps last Thursday and sent me to a Dr. But can not see me until the 6th of Jan. I can't wait that long cause Im in pain on my neck and jaw area.

## 2018-05-17 ENCOUNTER — Emergency Department (HOSPITAL_COMMUNITY)
Admission: EM | Admit: 2018-05-17 | Discharge: 2018-05-17 | Disposition: A | Discharge status: Home / Self Care | Payer: Self-pay | Attending: Emergency Medicine | Admitting: Emergency Medicine

## 2018-05-17 ENCOUNTER — Emergency Department (HOSPITAL_COMMUNITY): Payer: Self-pay

## 2018-05-17 DIAGNOSIS — K5900 Constipation, unspecified: Secondary | ICD-10-CM | POA: Insufficient documentation

## 2018-05-17 LAB — COMPREHENSIVE METABOLIC PANEL
ALT: 33 U/L (ref 0–44)
AST: 23 U/L (ref 15–41)
Albumin: 3.1 g/dL — ABNORMAL LOW (ref 3.5–5.0)
Alkaline Phosphatase: 44 U/L (ref 38–126)
Anion gap: 10 (ref 5–15)
BILIRUBIN TOTAL: 0.5 mg/dL (ref 0.3–1.2)
BUN: 12 mg/dL (ref 6–20)
CO2: 27 mmol/L (ref 22–32)
Calcium: 9.3 mg/dL (ref 8.9–10.3)
Chloride: 104 mmol/L (ref 98–111)
Creatinine, Ser: 0.65 mg/dL (ref 0.44–1.00)
GFR calc non Af Amer: >60 mL/min (ref >60)
Glucose, Bld: 99 mg/dL (ref 70–99)
Potassium: 3.5 mmol/L (ref 3.5–5.1)
Sodium: 141 mmol/L (ref 135–145)
Total Protein: 7.3 g/dL (ref 6.5–8.1)

## 2018-05-17 LAB — CBC WITH DIFFERENTIAL/PLATELET
Abs Immature Granulocytes: 0.01 10*3/uL (ref 0.00–0.07)
Basophils Absolute: 0.1 10*3/uL (ref 0.0–0.1)
Basophils Relative: 1 %
Eosinophils Absolute: 0.2 10*3/uL (ref 0.0–0.5)
Eosinophils Relative: 4 %
HEMATOCRIT: 34.1 % — AB (ref 36.0–46.0)
Hemoglobin: 11.1 g/dL — ABNORMAL LOW (ref 12.0–15.0)
Immature Granulocytes: 0 %
Lymphocytes Relative: 45 %
Lymphs Abs: 3 10*3/uL (ref 0.7–4.0)
MCH: 29.6 pg (ref 26.0–34.0)
MCHC: 32.6 g/dL (ref 30.0–36.0)
MCV: 90.9 fL (ref 80.0–100.0)
MONO ABS: 0.5 10*3/uL (ref 0.1–1.0)
Monocytes Relative: 8 %
Neutro Abs: 2.7 10*3/uL (ref 1.7–7.7)
Neutrophils Relative %: 42 %
Platelets: 345 10*3/uL (ref 150–400)
RBC: 3.75 MIL/uL — ABNORMAL LOW (ref 3.87–5.11)
RDW: 13.4 % (ref 11.5–15.5)
WBC: 6.5 10*3/uL (ref 4.0–10.5)
nRBC: 0 % (ref 0.0–0.2)

## 2018-05-17 LAB — URINALYSIS, ROUTINE W REFLEX MICROSCOPIC
BILIRUBIN URINE: NEGATIVE
Glucose, UA: NEGATIVE mg/dL
HGB URINE DIPSTICK: NEGATIVE
Ketones, ur: NEGATIVE mg/dL
Leukocytes, UA: NEGATIVE
Nitrite: NEGATIVE
Protein, ur: NEGATIVE mg/dL
Specific Gravity, Urine: 1.011 (ref 1.005–1.030)
pH: 6 (ref 5.0–8.0)

## 2018-05-17 LAB — LIPASE, BLOOD: Lipase: 20 U/L (ref 11–51)

## 2018-05-17 MED ORDER — ALUM & MAG HYDROXIDE-SIMETH 200-200-20 MG/5ML PO SUSP
30.0000 mL | Freq: Once | ORAL | Status: AC
  Administered 2018-05-17: 30 mL via ORAL
  Filled 2018-05-17: qty 30

## 2018-05-17 MED ORDER — LIDOCAINE VISCOUS HCL 2 % MT SOLN
15.0000 mL | Freq: Once | OROMUCOSAL | Status: AC
  Administered 2018-05-17: 15 mL via ORAL
  Filled 2018-05-17: qty 15

## 2018-05-17 MED ORDER — HYOSCYAMINE SULFATE 0.125 MG SL SUBL
0.2500 mg | SUBLINGUAL_TABLET | Freq: Once | SUBLINGUAL | Status: AC
  Administered 2018-05-17: 0.25 mg via SUBLINGUAL
  Filled 2018-05-17: qty 2

## 2018-05-17 MED ORDER — ACYCLOVIR 400 MG PO TABS: 400.0000 mg | ORAL_TABLET | Freq: Three times a day (TID) | ORAL | 0 refills | Status: DC

## 2018-05-17 MED ORDER — DOCUSATE SODIUM 250 MG PO CAPS: 250.0000 mg | ORAL_CAPSULE | Freq: Every day | ORAL | 0 refills | Status: DC

## 2018-05-17 NOTE — ED Triage Notes (Addendum)
Patient reports stomach pain x few week that comes and go, but recently it's been more consistent. She reports to have taken OTC meds for constipation without result. Last bowel movement was 3days ago

## 2018-05-17 NOTE — ED Provider Notes (Signed)
[facility]   CSN: [id number] Arrival date & time: 05/17/18  [id number]     History   Chief Complaint No chief complaint on file.   HPI Mary Heath is a 47 y.o. female.  47 year old female presents with abdominal discomfort times several days.  Patient does have a history of hiatal hernia and thinks that this may be the cause.  Denies any fever, vomiting, diarrhea.  Pain is epigastric in nature and does not radiate to her back.  Some urinary symptoms with this.  Also notes some constipation.  Has used over-the-counter medications without relief.     Past Medical History:  Diagnosis Date  . Depression   . HSV-2 infection   . Hyperlipidemia   . Umbilical hernia   . Varicose veins of both lower extremities     Patient Active Problem List   Diagnosis Date Noted  . DEPRESSION 01/14/2010  . BACK PAIN 01/14/2010  . HSV 06/20/2009  . HYPERCHOLESTEROLEMIA 06/19/2009  . VARICOSE VEINS, LOWER EXTREMITIES 06/19/2009  . TINGLING 06/19/2009  . LEG PAIN, LEFT 06/06/2009  . 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 ONYCHOMYCOSIS 12/05/2007  . OBESITY 12/05/2007  . UMBILICAL HERNIA 12/05/2007    Past Surgical History:  Procedure Laterality Date  . TUBAL LIGATION       OB History    Gravida  5   Para  5   Term  5   Preterm      AB      Living  5     SAB      TAB      Ectopic      Multiple      Live Births               Home Medications    Prior to Admission medications   Medication Sig Start Date End Date Taking? Authorizing Provider  acyclovir (ZOVIRAX) 400 MG tablet Take 1 tablet (400 mg total) by mouth 3 (three) times daily. 11/17/17   Derwood KaplanNanavati, Ankit, MD  bisacodyl (DULCOLAX) 5 MG EC tablet Take 1 tablet (5 mg total) by mouth daily as needed for moderate constipation. 10/04/17   Wynelle ClevelandMizera, Kathryn, MD  meloxicam (MOBIC) 15 MG tablet Take 1 tablet (15 mg total) by mouth daily. Take 1 daily with food.  05/14/18   Arthor CaptainHarris, Abigail, PA-C  omeprazole (PRILOSEC) 20 MG capsule Take 1 capsule (20 mg total) by mouth daily. 09/11/17   Arby BarrettePfeiffer, Marcy, MD  traMADol (ULTRAM) 50 MG tablet Take 1 tablet (50 mg total) by mouth every 6 (six) hours as needed. 05/14/18   Arthor CaptainHarris, Abigail, PA-C    Family History No family history on file.  Social History Social History   Tobacco Use  . Smoking status: Never Smoker  . Smokeless tobacco: Never Used  Substance Use Topics  . Alcohol use: No  . Drug use: No     Allergies   Patient has no known allergies.   Review of Systems Review of Systems  All other systems reviewed and are negative.    Physical Exam Updated Vital Signs BP 124/71 (BP Location: Right Arm)   Pulse 82   Temp 98.8 F (37.1 C) (Oral)   Resp 16   SpO2 97%   Physical Exam Vitals signs and nursing note reviewed.  Constitutional:      General: She is not in acute distress.    Appearance: Normal appearance. She is well-developed. She is not toxic-appearing.  HENT:  Head: Normocephalic and atraumatic.  Eyes:     General: Lids are normal.     Conjunctiva/sclera: Conjunctivae normal.     Pupils: Pupils are equal, round, and reactive to light.  Neck:     Musculoskeletal: Normal range of motion and neck supple.     Thyroid: No thyroid mass.     Trachea: No tracheal deviation.  Cardiovascular:     Rate and Rhythm: Normal rate and regular rhythm.     Heart sounds: Normal heart sounds. No murmur. No gallop.   Pulmonary:     Effort: Pulmonary effort is normal. No respiratory distress.     Breath sounds: Normal breath sounds. No stridor. No decreased breath sounds, wheezing, rhonchi or rales.  Abdominal:     General: Bowel sounds are normal. There is no distension.     Palpations: Abdomen is soft.     Tenderness: There is no abdominal tenderness. There is no guarding or rebound.  Musculoskeletal: Normal range of motion.        General: No tenderness.  Skin:    General:  Skin is warm and dry.     Findings: No abrasion or rash.  Neurological:     Mental Status: She is alert and oriented to person, place, and time.     GCS: GCS eye subscore is 4. GCS verbal subscore is 5. GCS motor subscore is 6.     Cranial Nerves: No cranial nerve deficit.     Sensory: No sensory deficit.  Psychiatric:        Speech: Speech normal.        Behavior: Behavior normal.      ED Treatments / Results  Labs (all labs ordered are listed, but only abnormal results are displayed) Labs Reviewed  URINE CULTURE  CBC WITH DIFFERENTIAL/PLATELET  COMPREHENSIVE METABOLIC PANEL  LIPASE, BLOOD  URINALYSIS, ROUTINE W REFLEX MICROSCOPIC    EKG None  Radiology No results found.  Procedures Procedures (including critical care time)  Medications Ordered in ED Medications  alum & mag hydroxide-simeth (MAALOX/MYLANTA) 200-200-20 MG/5ML suspension 30 mL (has no administration in time range)    And  lidocaine (XYLOCAINE) 2 % viscous mouth solution 15 mL (has no administration in time range)  hyoscyamine (LEVSIN SL) SL tablet 0.25 mg (has no administration in time range)     Initial Impression / Assessment and Plan / ED Course  I have reviewed the triage vital signs and the nursing notes.  Pertinent labs & imaging results that were available during my care of the patient were reviewed by me and considered in my medical decision making (see chart for details).    Patient's work-up consistent with constipation.  She is stable for discharge  Final Clinical Impressions(s) / ED Diagnoses   Final diagnoses:  None    ED Discharge Orders    None       Lorre NickAllen, Mirai Greenwood, MD 05/17/18 1212

## 2018-05-17 NOTE — ED Notes (Signed)
D/c reviewed with patient 

## 2018-05-17 NOTE — ED Notes (Signed)
Patient transported to X-ray 

## 2018-05-18 LAB — URINE CULTURE: Culture: <10000 — AB

## 2018-05-30 ENCOUNTER — Inpatient Hospital Stay: Payer: Self-pay | Admitting: Family Medicine

## 2019-10-08 ENCOUNTER — Other Ambulatory Visit: Payer: Self-pay

## 2019-10-08 ENCOUNTER — Emergency Department (HOSPITAL_BASED_OUTPATIENT_CLINIC_OR_DEPARTMENT_OTHER)
Admission: EM | Admit: 2019-10-08 | Discharge: 2019-10-08 | Disposition: A | Discharge status: Home / Self Care | Payer: 59 | Attending: Emergency Medicine | Admitting: Emergency Medicine

## 2019-10-08 ENCOUNTER — Encounter (HOSPITAL_BASED_OUTPATIENT_CLINIC_OR_DEPARTMENT_OTHER): Payer: Self-pay | Admitting: Emergency Medicine

## 2019-10-08 ENCOUNTER — Emergency Department (HOSPITAL_BASED_OUTPATIENT_CLINIC_OR_DEPARTMENT_OTHER): Payer: 59

## 2019-10-08 DIAGNOSIS — R079 Chest pain, unspecified: Secondary | ICD-10-CM | POA: Insufficient documentation

## 2019-10-08 DIAGNOSIS — R0789 Other chest pain: Secondary | ICD-10-CM | POA: Diagnosis present

## 2019-10-08 LAB — CBC
HCT: 36 % (ref 36.0–46.0)
Hemoglobin: 11.9 g/dL — ABNORMAL LOW (ref 12.0–15.0)
MCH: 29.4 pg (ref 26.0–34.0)
MCHC: 33.1 g/dL (ref 30.0–36.0)
MCV: 88.9 fL (ref 80.0–100.0)
Platelets: 234 10*3/uL (ref 150–400)
RBC: 4.05 MIL/uL (ref 3.87–5.11)
RDW: 14.3 % (ref 11.5–15.5)
WBC: 5.3 10*3/uL (ref 4.0–10.5)
nRBC: 0 % (ref 0.0–0.2)

## 2019-10-08 LAB — BASIC METABOLIC PANEL
Anion gap: 10 (ref 5–15)
BUN: 13 mg/dL (ref 6–20)
CO2: 23 mmol/L (ref 22–32)
Calcium: 8.9 mg/dL (ref 8.9–10.3)
Chloride: 106 mmol/L (ref 98–111)
Creatinine, Ser: 0.68 mg/dL (ref 0.44–1.00)
GFR calc Af Amer: >60 mL/min (ref >60)
GFR calc non Af Amer: >60 mL/min (ref >60)
Glucose, Bld: 124 mg/dL — ABNORMAL HIGH (ref 70–99)
Potassium: 3.5 mmol/L (ref 3.5–5.1)
Sodium: 139 mmol/L (ref 135–145)

## 2019-10-08 LAB — TROPONIN I (HIGH SENSITIVITY): Troponin I (High Sensitivity): 5 ng/L (ref <18)

## 2019-10-08 MED ORDER — KETOROLAC TROMETHAMINE 30 MG/ML IJ SOLN
30.0000 mg | Freq: Once | INTRAMUSCULAR | Status: AC
  Administered 2019-10-08: 30 mg via INTRAVENOUS
  Filled 2019-10-08: qty 1

## 2019-10-08 NOTE — ED Triage Notes (Signed)
R side chest pain x 2 days, radiates into back.

## 2019-10-08 NOTE — Discharge Instructions (Signed)
Take ibuprofen 600 mg every 6 hours as needed for pain.  Follow-up with cardiology in the next few days.  The contact information for the [facility] cardiology clinic has been provided in this discharge summary for you to call and make these arrangements.

## 2019-10-08 NOTE — ED Provider Notes (Signed)
[facility] EMERGENCY DEPARTMENT Provider Note   CSN: [id number] Arrival date & time: 10/08/19  2202     History Chief Complaint  Patient presents with  . Chest Pain    Mary Heath is a 49 y.o. female.  Patient is a 49 year old female with history of obesity, hyperlipidemia.  She presents today for evaluation of chest discomfort.  This began on Friday (2 days ago) and has been persistent.  She describes it as sharp in nature and worse when she breathes or moves.  She denies shortness of breath, nausea, diaphoresis, or radiation to the arm or jaw.  She denies any recent exertional symptoms.  She has the risk factors of hyperlipidemia and obesity.  She has no prior cardiac history and denies to be having had a stress test or heart cath in the past.  The history is provided by the patient.  Chest Pain Pain location:  Substernal area Pain quality: sharp   Pain radiates to:  Does not radiate Pain severity:  Moderate Onset quality:  Sudden Duration:  2 days Timing:  Constant Progression:  Unchanged Chronicity:  New Relieved by:  Nothing Worsened by:  Movement and deep breathing Ineffective treatments:  None tried      Past Medical History:  Diagnosis Date  . Depression   . HSV-2 infection   . Hyperlipidemia   . Umbilical hernia   . Varicose veins of both lower extremities     Patient Active Problem List   Diagnosis Date Noted  . DEPRESSION 01/14/2010  . BACK PAIN 01/14/2010  . HSV 06/20/2009  . HYPERCHOLESTEROLEMIA 06/19/2009  . VARICOSE VEINS, LOWER EXTREMITIES 06/19/2009  . TINGLING 06/19/2009  . LEG PAIN, LEFT 06/06/2009  . 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 ONYCHOMYCOSIS 12/05/2007  . OBESITY 12/05/2007  . UMBILICAL HERNIA 12/05/2007    Past Surgical History:  Procedure Laterality Date  . TUBAL LIGATION       OB History    Gravida  5   Para  5   Term  5   Preterm      AB      Living  5     SAB      TAB      Ectopic       Multiple      Live Births              No family history on file.  Social History   Tobacco Use  . Smoking status: Never Smoker  . Smokeless tobacco: Never Used  Substance Use Topics  . Alcohol use: No  . Drug use: No    Home Medications Prior to Admission medications   Medication Sig Start Date End Date Taking? Authorizing Provider  acetaminophen (TYLENOL) 325 MG tablet Take 975 mg by mouth every 6 (six) hours as needed for mild pain or moderate pain.    [provider]  acyclovir (ZOVIRAX) 400 MG tablet Take 1 tablet (400 mg total) by mouth 3 (three) times daily. 05/17/18   Lorre Nick, MD  bisacodyl (DULCOLAX) 5 MG EC tablet Take 1 tablet (5 mg total) by mouth daily as needed for moderate constipation. Patient not taking: Reported on 05/17/2018 10/04/17   Wynelle Cleveland, MD  docusate sodium (COLACE) 250 MG capsule Take 1 capsule (250 mg total) by mouth daily. 05/17/18   Lorre Nick, MD  meloxicam (MOBIC) 15 MG tablet Take 1 tablet (15 mg total) by mouth daily. Take 1 daily with food. Patient not taking:  Reported on 05/17/2018 05/14/18   Arthor Captain, PA-C  omeprazole (PRILOSEC) 20 MG capsule Take 1 capsule (20 mg total) by mouth daily. Patient not taking: Reported on 05/17/2018 09/11/17   Arby Barrette, MD  traMADol (ULTRAM) 50 MG tablet Take 1 tablet (50 mg total) by mouth every 6 (six) hours as needed. Patient not taking: Reported on 05/17/2018 05/14/18   Arthor Captain, PA-C    Allergies    Patient has no known allergies.  Review of Systems   Review of Systems  Cardiovascular: Positive for chest pain.  All other systems reviewed and are negative.   Physical Exam Updated Vital Signs BP 135/86 (BP Location: Left Arm)   Pulse 96   Temp 97.8 F (36.6 C) (Oral)   Resp 20   Ht 5\' 3"  (1.6 m)   Wt 126.6 kg   SpO2 99%   BMI 49.42 kg/m   Physical Exam Vitals and nursing note reviewed.  Constitutional:      General: She is not in acute  distress.    Appearance: She is well-developed. She is not diaphoretic.  HENT:     Head: Normocephalic and atraumatic.  Cardiovascular:     Rate and Rhythm: Normal rate and regular rhythm.     Heart sounds: No murmur. No friction rub. No gallop.   Pulmonary:     Effort: Pulmonary effort is normal. No respiratory distress.     Breath sounds: Normal breath sounds. No wheezing.     Comments: There is tenderness to palpation over the right upper sternum.  There is no crepitus or palpable abnormality.  This reproduces her symptoms. Abdominal:     General: Bowel sounds are normal. There is no distension.     Palpations: Abdomen is soft.     Tenderness: There is no abdominal tenderness.  Musculoskeletal:        General: Normal range of motion.     Cervical back: Normal range of motion and neck supple.  Skin:    General: Skin is warm and dry.  Neurological:     Mental Status: She is alert and oriented to person, place, and time.     ED Results / Procedures / Treatments   Labs (all labs ordered are listed, but only abnormal results are displayed) Labs Reviewed  CBC - Abnormal; Notable for the following components:      Result Value   Hemoglobin 11.9 (*)    All other components within normal limits  BASIC METABOLIC PANEL  TROPONIN I (HIGH SENSITIVITY)    EKG EKG Interpretation  Date/Time:  Sunday Oct 08 2019 09:58:26 EDT Ventricular Rate:  94 PR Interval:  136 QRS Duration: 80 QT Interval:  364 QTC Calculation: 455 R Axis:     Text Interpretation: Normal sinus rhythm Minimal voltage criteria for LVH, may be normal variant ( R in aVL ) Nonspecific T wave abnormality Abnormal ECG No significant change since 07/03/2016 Confirmed by 08/31/2016 (Geoffery Lyons) on 10/08/2019 10:28:33 AM   Radiology DG Chest 2 View  Result Date: 10/08/2019 CLINICAL DATA:  RIGHT-sided chest pain. Lump in RIGHT breast. EXAM: CHEST - 2 VIEW COMPARISON:  Chest x-ray dated 05/14/2018. FINDINGS: Heart size and  mediastinal contours are within normal limits. Lungs are clear. No pleural effusion or pneumothorax is seen. Osseous structures about the chest are unremarkable. IMPRESSION: 1. No active cardiopulmonary disease. No evidence of pneumonia or pulmonary edema. 2. Given patient's description of a lump in RIGHT breast, recommend nonemergent diagnostic mammogram and ultrasound at  a dedicated breast imaging center. Electronically Signed   By: Franki Cabot M.D.   On: 10/08/2019 10:46    Procedures Procedures (including critical care time)  Medications Ordered in ED Medications  ketorolac (TORADOL) 30 MG/ML injection 30 mg (has no administration in time range)    ED Course  I have reviewed the triage vital signs and the nursing notes.  Pertinent labs & imaging results that were available during my care of the patient were reviewed by me and considered in my medical decision making (see chart for details).    MDM Rules/Calculators/A&P  Patient is a 48 year old female presenting with complaints of chest discomfort.  This seems musculoskeletal in nature as it is sharp in nature and reproducible with palpation and deep breathing.  There is no hypoxia and no tachycardia.  She has negative troponin and unchanged EKG after 2 days worth of symptoms.  Patient given Toradol and seems to be feeling better.  At this point, I feel as though discharge with an anti-inflammatory and as needed return is appropriate.  Patient is 49 years old with risk factors of obesity and hyperlipidemia.  It may be in her best interest to have cardiology follow-up irregardless just to rule out a cardiac etiology.  She will be given the contact information for the [facility] health cardiology group to arrange this appointment.  Final Clinical Impression(s) / ED Diagnoses Final diagnoses:  None    Rx / DC Orders ED Discharge Orders    None       Veryl Speak, MD 10/08/19 1417

## 2020-01-09 ENCOUNTER — Encounter (HOSPITAL_COMMUNITY): Payer: Self-pay

## 2020-01-09 ENCOUNTER — Encounter (HOSPITAL_BASED_OUTPATIENT_CLINIC_OR_DEPARTMENT_OTHER): Payer: Self-pay

## 2020-01-09 ENCOUNTER — Emergency Department (HOSPITAL_COMMUNITY)
Admission: EM | Admit: 2020-01-09 | Discharge: 2020-01-09 | Disposition: A | Discharge status: Home / Self Care | Payer: 59 | Attending: Emergency Medicine | Admitting: Emergency Medicine

## 2020-01-09 ENCOUNTER — Emergency Department (HOSPITAL_BASED_OUTPATIENT_CLINIC_OR_DEPARTMENT_OTHER)
Admission: EM | Admit: 2020-01-09 | Discharge: 2020-01-09 | Disposition: A | Discharge status: Home / Self Care | Payer: 59 | Source: Home / Self Care | Attending: Emergency Medicine | Admitting: Emergency Medicine

## 2020-01-09 ENCOUNTER — Other Ambulatory Visit: Payer: Self-pay

## 2020-01-09 DIAGNOSIS — Z5321 Procedure and treatment not carried out due to patient leaving prior to being seen by health care provider: Secondary | ICD-10-CM | POA: Diagnosis not present

## 2020-01-09 DIAGNOSIS — M542 Cervicalgia: Secondary | ICD-10-CM | POA: Diagnosis not present

## 2020-01-09 DIAGNOSIS — M62838 Other muscle spasm: Secondary | ICD-10-CM

## 2020-01-09 MED ORDER — ACETAMINOPHEN 500 MG PO TABS
ORAL_TABLET | ORAL | Status: AC
  Administered 2020-01-09: 650 mg via ORAL
  Filled 2020-01-09: qty 2

## 2020-01-09 MED ORDER — IBUPROFEN 800 MG PO TABS: 800.0000 mg | ORAL_TABLET | Freq: Once | ORAL | Status: AC

## 2020-01-09 MED ORDER — OXYCODONE-ACETAMINOPHEN 5-325 MG PO TABS: 1.0000 | ORAL_TABLET | Freq: Once | ORAL | Status: DC

## 2020-01-09 MED ORDER — DIAZEPAM 5 MG PO TABS: 5.0000 mg | ORAL_TABLET | Freq: Four times a day (QID) | ORAL | 0 refills | Status: DC | PRN

## 2020-01-09 MED ORDER — OXYCODONE HCL 5 MG PO TABS
ORAL_TABLET | ORAL | Status: AC
  Administered 2020-01-09: 5 mg
  Filled 2020-01-09: qty 1

## 2020-01-09 MED ORDER — IBUPROFEN 800 MG PO TABS
ORAL_TABLET | ORAL | Status: AC
  Administered 2020-01-09: 800 mg via ORAL
  Filled 2020-01-09: qty 1

## 2020-01-09 MED ORDER — ACETAMINOPHEN 325 MG PO TABS: 650.0000 mg | ORAL_TABLET | Freq: Once | ORAL | Status: AC

## 2020-01-09 MED FILL — diazePAM 5 MG TABS: 5 | 2 days supply | Qty: 5 | Fill #0

## 2020-01-09 NOTE — ED Provider Notes (Signed)
[facility] EMERGENCY DEPARTMENT Provider Note   CSN: [id number] Arrival date & time: 01/09/20  0701     History Chief Complaint  Patient presents with  . Neck Pain    Mary Heath is a 49 y.o. female.  49 yo F with a chief complaints of left-sided neck pain.  She woke up like this a couple days ago.  Worse with twisting moving palpation.  She denies trauma to the area denies numbness or weakness to the arm.  Denies head injury.  The history is provided by the patient.  Neck Pain Associated symptoms: no chest pain, no fever and no headaches   Illness Severity:  Moderate Onset quality:  Gradual Duration:  2 days Timing:  Constant Progression:  Unchanged Chronicity:  New Associated symptoms: myalgias   Associated symptoms: no chest pain, no congestion, no fever, no headaches, no nausea, no rhinorrhea, no shortness of breath, no vomiting and no wheezing        Past Medical History:  Diagnosis Date  . Depression   . HSV-2 infection   . Hyperlipidemia   . Umbilical hernia   . Varicose veins of both lower extremities     Patient Active Problem List   Diagnosis Date Noted  . DEPRESSION 01/14/2010  . BACK PAIN 01/14/2010  . HSV 06/20/2009  . HYPERCHOLESTEROLEMIA 06/19/2009  . VARICOSE VEINS, LOWER EXTREMITIES 06/19/2009  . TINGLING 06/19/2009  . LEG PAIN, LEFT 06/06/2009  . 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 ONYCHOMYCOSIS 12/05/2007  . OBESITY 12/05/2007  . UMBILICAL HERNIA 12/05/2007    Past Surgical History:  Procedure Laterality Date  . TUBAL LIGATION       OB History    Gravida  5   Para  5   Term  5   Preterm      AB      Living  5     SAB      TAB      Ectopic      Multiple      Live Births              No family history on file.  Social History   Tobacco Use  . Smoking status: Never Smoker  . Smokeless tobacco: Never Used  Vaping Use  . Vaping Use: Never used  Substance Use Topics  . Alcohol use: No   . Drug use: No    Home Medications Prior to Admission medications   Medication Sig Start Date End Date Taking? Authorizing Provider  acetaminophen (TYLENOL) 325 MG tablet Take 975 mg by mouth every 6 (six) hours as needed for mild pain or moderate pain.    [provider]  acyclovir (ZOVIRAX) 400 MG tablet Take 1 tablet (400 mg total) by mouth 3 (three) times daily. 05/17/18   Lorre Nick, MD  bisacodyl (DULCOLAX) 5 MG EC tablet Take 1 tablet (5 mg total) by mouth daily as needed for moderate constipation. Patient not taking: Reported on 05/17/2018 10/04/17   Wynelle Cleveland, MD  diazepam (VALIUM) 5 MG tablet Take 1 tablet (5 mg total) by mouth every 6 (six) hours as needed for muscle spasms (spasms). 01/09/20   Melene Plan, DO  docusate sodium (COLACE) 250 MG capsule Take 1 capsule (250 mg total) by mouth daily. 05/17/18   Lorre Nick, MD  meloxicam (MOBIC) 15 MG tablet Take 1 tablet (15 mg total) by mouth daily. Take 1 daily with food. Patient not taking: Reported on 05/17/2018 05/14/18  Arthor Captain, PA-C  omeprazole (PRILOSEC) 20 MG capsule Take 1 capsule (20 mg total) by mouth daily. Patient not taking: Reported on 05/17/2018 09/11/17   Arby Barrette, MD  traMADol (ULTRAM) 50 MG tablet Take 1 tablet (50 mg total) by mouth every 6 (six) hours as needed. Patient not taking: Reported on 05/17/2018 05/14/18   Arthor Captain, PA-C    Allergies    Patient has no known allergies.  Review of Systems   Review of Systems  Constitutional: Negative for chills and fever.  HENT: Negative for congestion and rhinorrhea.   Eyes: Negative for redness and visual disturbance.  Respiratory: Negative for shortness of breath and wheezing.   Cardiovascular: Negative for chest pain and palpitations.  Gastrointestinal: Negative for nausea and vomiting.  Genitourinary: Negative for dysuria and urgency.  Musculoskeletal: Positive for arthralgias, myalgias and neck pain.  Skin: Negative  for pallor and wound.  Neurological: Negative for dizziness and headaches.    Physical Exam Updated Vital Signs BP (!) 146/99 (BP Location: Left Wrist)   Pulse (!) 102   Temp 99.1 F (37.3 C) (Oral)   Resp 20   Ht 5\' 3"  (1.6 m)   Wt 129.3 kg   SpO2 98%   BMI 50.49 kg/m   Physical Exam Vitals and nursing note reviewed.  Constitutional:      General: She is not in acute distress.    Appearance: She is well-developed. She is not diaphoretic.  HENT:     Head: Normocephalic and atraumatic.  Eyes:     Pupils: Pupils are equal, round, and reactive to light.  Cardiovascular:     Rate and Rhythm: Normal rate and regular rhythm.     Heart sounds: No murmur heard.  No friction rub. No gallop.   Pulmonary:     Effort: Pulmonary effort is normal.     Breath sounds: No wheezing or rales.  Abdominal:     General: There is no distension.     Palpations: Abdomen is soft.     Tenderness: There is no abdominal tenderness.  Musculoskeletal:        General: Tenderness present.     Cervical back: Normal range of motion and neck supple.     Comments: Tenderness about the trapezius muscle belly on the left.  No midline C-spine tenderness.  Pulse motor and sensation intact to the left upper extremity.  No appreciable weakness.  Skin:    General: Skin is warm and dry.  Neurological:     Mental Status: She is alert and oriented to person, place, and time.  Psychiatric:        Behavior: Behavior normal.     ED Results / Procedures / Treatments   Labs (all labs ordered are listed, but only abnormal results are displayed) Labs Reviewed - No data to display  EKG None  Radiology No results found.  Procedures Procedures (including critical care time)  Medications Ordered in ED Medications  acetaminophen (TYLENOL) tablet 650 mg (650 mg Oral Given 01/09/20 1232)  ibuprofen (ADVIL) tablet 800 mg (800 mg Oral Given 01/09/20 1233)  oxyCODONE (Oxy IR/ROXICODONE) 5 MG immediate release  tablet (5 mg  Given 01/09/20 1236)    ED Course  I have reviewed the triage vital signs and the nursing notes.  Pertinent labs & imaging results that were available during my care of the patient were reviewed by me and considered in my medical decision making (see chart for details).    MDM Rules/Calculators/A&P  49 yo F with a chief complaints of left-sided neck pain.  Most likely is trapezius spasm.  Will place in a sling for comfort.  PCP follow-up.  Valium at night for muscle spasms.  12:37 PM:  I have discussed the diagnosis/risks/treatment options with the patient and believe the pt to be eligible for discharge home to follow-up with PCP. We also discussed returning to the ED immediately if new or worsening sx occur. We discussed the sx which are most concerning (e.g., sudden worsening pain, fever, inability to tolerate by mouth) that necessitate immediate return. Medications administered to the patient during their visit and any new prescriptions provided to the patient are listed below.  Medications given during this visit Medications  acetaminophen (TYLENOL) tablet 650 mg (650 mg Oral Given 01/09/20 1232)  ibuprofen (ADVIL) tablet 800 mg (800 mg Oral Given 01/09/20 1233)  oxyCODONE (Oxy IR/ROXICODONE) 5 MG immediate release tablet (5 mg  Given 01/09/20 1236)     The patient appears reasonably screen and/or stabilized for discharge and I doubt any other medical condition or other [facility] requiring further screening, evaluation, or treatment in the ED at this time prior to discharge.   Final Clinical Impression(s) / ED Diagnoses Final diagnoses:  Trapezius muscle spasm    Rx / DC Orders ED Discharge Orders         Ordered    diazepam (VALIUM) 5 MG tablet  Every 6 hours PRN     Discontinue  Reprint     01/09/20 1234           Melene Plan, DO 01/09/20 1237

## 2020-01-09 NOTE — Discharge Instructions (Signed)
Take 4 over the counter ibuprofen tablets 3 times a day or 2 over-the-counter naproxen tablets twice a day for pain. Also take tylenol 1000mg(2 extra strength) four times a day.    

## 2020-01-09 NOTE — ED Triage Notes (Signed)
Pt arrives with c/o stiffness to left posterior neck X2 days. Pt reports using heat pads and has lidocaine patch on at this time.

## 2020-01-09 NOTE — ED Triage Notes (Signed)
Left sided neck pain x 2 days.

## 2020-01-09 NOTE — ED Notes (Signed)
ED Provider at bedside. 

## 2020-04-25 ENCOUNTER — Emergency Department (HOSPITAL_BASED_OUTPATIENT_CLINIC_OR_DEPARTMENT_OTHER)
Admission: EM | Admit: 2020-04-25 | Discharge: 2020-04-25 | Disposition: A | Discharge status: Home / Self Care | Payer: 59 | Attending: Emergency Medicine | Admitting: Emergency Medicine

## 2020-04-25 ENCOUNTER — Encounter (HOSPITAL_BASED_OUTPATIENT_CLINIC_OR_DEPARTMENT_OTHER): Payer: Self-pay | Admitting: *Deleted

## 2020-04-25 ENCOUNTER — Other Ambulatory Visit: Payer: Self-pay

## 2020-04-25 DIAGNOSIS — J029 Acute pharyngitis, unspecified: Secondary | ICD-10-CM | POA: Insufficient documentation

## 2020-04-25 DIAGNOSIS — Z79899 Other long term (current) drug therapy: Secondary | ICD-10-CM | POA: Insufficient documentation

## 2020-04-25 DIAGNOSIS — J069 Acute upper respiratory infection, unspecified: Secondary | ICD-10-CM

## 2020-04-25 DIAGNOSIS — R0981 Nasal congestion: Secondary | ICD-10-CM | POA: Insufficient documentation

## 2020-04-25 MED ORDER — HYDROCOD POLST-CPM POLST ER 10-8 MG/5ML PO SUER
5.0000 mL | Freq: Once | ORAL | Status: AC
  Administered 2020-04-25: 5 mL via ORAL
  Filled 2020-04-25: qty 5

## 2020-04-25 MED ORDER — ALBUTEROL SULFATE HFA 108 (90 BASE) MCG/ACT IN AERS
2.0000 | INHALATION_SPRAY | RESPIRATORY_TRACT | Status: DC | PRN
  Administered 2020-04-25: 2 via RESPIRATORY_TRACT
  Filled 2020-04-25: qty 6.7

## 2020-04-25 MED ORDER — PROMETHAZINE-CODEINE 6.25-10 MG/5ML PO SYRP: 5.0000 mL | ORAL_SOLUTION | ORAL | 0 refills | Status: DC | PRN

## 2020-04-25 NOTE — ED Notes (Signed)
Discharge instructions discussed with patient. Verbalized understanding. Departs ED at this time in stable condition.  

## 2020-04-25 NOTE — ED Triage Notes (Signed)
C/o URI sytmpoms  X 3 days

## 2020-04-25 NOTE — ED Provider Notes (Signed)
[facility] Provider Note: Lowella Dell, MD, FACEP  CSN: [id number] MRN: [id number] ARRIVAL: 04/25/20 at 0029 ROOM: MH09/MH09   CHIEF COMPLAINT  URI   HISTORY OF PRESENT ILLNESS  04/25/20 1:15 AM Mary Heath is a 49 y.o. female with 3 days of nasal congestion, scratchy throat and cough.  The cough is sometimes worse at night when lying supine.  It comes in paroxysms and she has had some mild urinary stress incontinence with this.  She denies body aches, fever, loss of taste or smell, nausea, vomiting or diarrhea.   Past Medical History:  Diagnosis Date   Depression    HSV-2 infection    Hyperlipidemia    Umbilical hernia    Varicose veins of both lower extremities     Past Surgical History:  Procedure Laterality Date   TUBAL LIGATION      No family history on file.  Social History   Tobacco Use   Smoking status: Never Smoker   Smokeless tobacco: Never Used  Vaping Use   Vaping Use: Never used  Substance Use Topics   Alcohol use: No   Drug use: No    Prior to Admission medications   Medication Sig Start Date End Date Taking? Authorizing Provider  diazepam (VALIUM) 5 MG tablet Take 1 tablet (5 mg total) by mouth every 6 (six) hours as needed for muscle spasms (spasms). 01/09/20   Melene Plan, DO  promethazine-codeine (PHENERGAN WITH CODEINE) 6.25-10 MG/5ML syrup Take 5 mLs by mouth every 4 (four) hours as needed for cough. 04/25/20   Cristela Stalder, MD  omeprazole (PRILOSEC) 20 MG capsule Take 1 capsule (20 mg total) by mouth daily. Patient not taking: Reported on 05/17/2018 09/11/17 04/25/20  Arby Barrette, MD    Allergies Patient has no known allergies.   REVIEW OF SYSTEMS  Negative except as noted here or in the History of Present Illness.   PHYSICAL EXAMINATION  Initial Vital Signs Blood pressure (!) 158/94, pulse (!) 102, temperature 98.2 F (36.8 C), resp. rate 18, height 5\' 3"  (1.6 m), weight 126.6 kg, SpO2 99  %.  Examination General: Well-developed, well-nourished female in no acute distress; appearance consistent with age of record HENT: normocephalic; atraumatic; no pharyngeal erythema Eyes: pupils equal, round and reactive to light; extraocular muscles intact Neck: supple Heart: regular rate and rhythm Lungs: clear to auscultation bilaterally; frequent cough Abdomen: soft; nondistended; nontender; bowel sounds present Extremities: No deformity; full range of motion; pulses normal Neurologic: Awake, alert and oriented; motor function intact in all extremities and symmetric; no facial droop Skin: Warm and dry Psychiatric: Normal mood and affect   RESULTS  Summary of this visit's results, reviewed and interpreted by myself:   EKG Interpretation  Date/Time:    Ventricular Rate:    PR Interval:    QRS Duration:   QT Interval:    QTC Calculation:   R Axis:     Text Interpretation:        Laboratory Studies: No results found for this or any previous visit (from the past 24 hour(s)). Imaging Studies: No results found.  ED COURSE and MDM  Nursing notes, initial and subsequent vitals signs, including pulse oximetry, reviewed and interpreted by myself.  Vitals:   04/25/20 0035 04/25/20 0036  BP:  (!) 158/94  Pulse: (!) 102   Resp: 18   Temp: 98.2 F (36.8 C)   SpO2: 99%   Weight: 126.6 kg   Height: 5\' 3"  (1.6 m)  Medications  chlorpheniramine-HYDROcodone (TUSSIONEX) 10-8 MG/5ML suspension 5 mL (has no administration in time range)  albuterol (VENTOLIN HFA) 108 (90 Base) MCG/ACT inhaler 2 puff (has no administration in time range)    We will treat with albuterol and Tussionex for acute bronchitis.  PROCEDURES  Procedures   ED DIAGNOSES     ICD-10-CM   1. Viral URI with cough  J06.9        Tylea Hise, MD 04/25/20 [phone]

## 2020-04-26 ENCOUNTER — Ambulatory Visit (HOSPITAL_COMMUNITY)
Admission: EM | Admit: 2020-04-26 | Discharge: 2020-04-26 | Disposition: A | Discharge status: Home / Self Care | Payer: Self-pay

## 2020-04-26 ENCOUNTER — Other Ambulatory Visit: Payer: Self-pay

## 2020-04-26 NOTE — ED Triage Notes (Signed)
49 yo presents as walk-in stating, "my doctor wanted me to come in last week and I didn't feel like it. I told him I was having thoughts to hurt myself but I didn't. I was started on this medicine yesterday (Lexapro) by my doctor, so I thought I minus well stop in here today". Pt denies SI/HI/AVH. No acute distress noted. Pt dismissed with resource to come back for outpatient services on Monday. Informed pt to report back sooner if thoughts to harm self/others arise. Verbalized understanding.

## 2020-04-26 NOTE — ED Notes (Signed)
49 yo female walk-in presents

## 2020-05-07 ENCOUNTER — Telehealth (HOSPITAL_COMMUNITY): Payer: Self-pay

## 2020-05-07 NOTE — Telephone Encounter (Signed)
Care Management - Follow Up [facility] Discharges   Writer attempted to make contact with patient today and was unsuccessful.  Writer was able to leave a HIPPA compliant voice message and will await callback.   

## 2020-07-17 ENCOUNTER — Encounter (HOSPITAL_COMMUNITY): Payer: Self-pay

## 2020-07-17 ENCOUNTER — Ambulatory Visit (INDEPENDENT_AMBULATORY_CARE_PROVIDER_SITE_OTHER): Payer: 59 | Admitting: Licensed Clinical Social Worker

## 2020-07-17 ENCOUNTER — Other Ambulatory Visit: Payer: Self-pay

## 2020-07-17 ENCOUNTER — Ambulatory Visit (HOSPITAL_COMMUNITY): Payer: 59 | Admitting: Psychiatry

## 2020-07-17 ENCOUNTER — Telehealth (HOSPITAL_COMMUNITY): Payer: Self-pay | Admitting: Psychiatry

## 2020-07-17 DIAGNOSIS — F418 Other specified anxiety disorders: Secondary | ICD-10-CM

## 2020-07-17 NOTE — Telephone Encounter (Signed)
Patient was scheduled to be seen by the writer for initial psychiatric evaluation.  When the writer started talking to her patient informed that she was recently prescribed medications by a psychiatry provider at [facility].  She stated that she just started taking them about 1.5 weeks ago and she was not sure if the medicines were helping or not. She could not recall the name of the medications or the date of the provider. Writer contacted the pharmacy and found out the patient was prescribed Wellbutrin XL 150 mg every morning, trazodone 50 mg at bedtime and risperidone 2 mg at bedtime by Dr. Lolly Mustache who is a psychiatrist.  She informed that she is supposed to follow-up with him in a few weeks.  Writer verified the patient does not have any ongoing suicidal ideations or intent to hurt herself.  She scored 12 on PHQ-9 and was assessed to have low suicide risk on [place] suicide scale.    Writer recommended that she continues to follow-up with Dr. Ezzard Standing for her psychiatry care.  She was seen by therapist Ms. Hatch at 1 PM, she stated that she would like to continue seeing the therapist.  Writer informed the front desk staff to schedule an appointment for follow-up with Ms. Hatch.
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
## 2020-07-23 ENCOUNTER — Other Ambulatory Visit: Payer: Self-pay | Admitting: Nurse Practitioner

## 2020-07-23 DIAGNOSIS — Z1231 Encounter for screening mammogram for malignant neoplasm of breast: Secondary | ICD-10-CM

## 2020-09-06 ENCOUNTER — Telehealth (INDEPENDENT_AMBULATORY_CARE_PROVIDER_SITE_OTHER): Payer: 59 | Admitting: Licensed Clinical Social Worker

## 2020-09-06 DIAGNOSIS — F418 Other specified anxiety disorders: Secondary | ICD-10-CM
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
## 2020-09-13 ENCOUNTER — Ambulatory Visit: Payer: 59

## 2020-09-20 ENCOUNTER — Encounter (HOSPITAL_BASED_OUTPATIENT_CLINIC_OR_DEPARTMENT_OTHER): Payer: Self-pay | Admitting: Emergency Medicine

## 2020-09-20 ENCOUNTER — Emergency Department (HOSPITAL_BASED_OUTPATIENT_CLINIC_OR_DEPARTMENT_OTHER): Payer: 59

## 2020-09-20 ENCOUNTER — Emergency Department (HOSPITAL_BASED_OUTPATIENT_CLINIC_OR_DEPARTMENT_OTHER)
Admission: EM | Admit: 2020-09-20 | Discharge: 2020-09-21 | Disposition: A | Discharge status: Home / Self Care | Payer: 59 | Attending: Emergency Medicine | Admitting: Emergency Medicine

## 2020-09-20 DIAGNOSIS — M25562 Pain in left knee: Secondary | ICD-10-CM | POA: Insufficient documentation

## 2020-09-20 NOTE — ED Notes (Signed)
Pt is in xray

## 2020-09-20 NOTE — ED Triage Notes (Signed)
Pt states left knee pain X 2 months radiates at times to hip and other times to shin. Pt states started after a fall.

## 2020-09-21 ENCOUNTER — Encounter (HOSPITAL_BASED_OUTPATIENT_CLINIC_OR_DEPARTMENT_OTHER): Payer: Self-pay | Admitting: Emergency Medicine

## 2020-09-21 MED ORDER — IBUPROFEN 800 MG PO TABS: 800.0000 mg | ORAL_TABLET | Freq: Three times a day (TID) | ORAL | 0 refills | Status: DC

## 2020-09-21 MED ORDER — LIDOCAINE 5 % EX PTCH: 1.0000 | MEDICATED_PATCH | CUTANEOUS | 0 refills | Status: AC

## 2020-09-21 MED ORDER — ACETAMINOPHEN 500 MG PO TABS
1000.0000 mg | ORAL_TABLET | Freq: Once | ORAL | Status: DC
  Filled 2020-09-21: qty 2

## 2020-09-21 MED ORDER — NAPROXEN 250 MG PO TABS
500.0000 mg | ORAL_TABLET | Freq: Once | ORAL | Status: AC
  Administered 2020-09-21: 500 mg via ORAL
  Filled 2020-09-21: qty 2

## 2020-09-21 MED ORDER — DICLOFENAC SODIUM ER 100 MG PO TB24: 100.0000 mg | ORAL_TABLET | Freq: Every day | ORAL | 0 refills | Status: DC

## 2020-09-21 MED ORDER — LIDOCAINE 5 % EX PTCH
2.0000 | MEDICATED_PATCH | CUTANEOUS | Status: DC
  Administered 2020-09-21: 2 via TRANSDERMAL
  Filled 2020-09-21: qty 2
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
## 2020-09-27 ENCOUNTER — Telehealth (HOSPITAL_COMMUNITY): Payer: Self-pay | Admitting: Licensed Clinical Social Worker

## 2020-09-27 ENCOUNTER — Ambulatory Visit (HOSPITAL_COMMUNITY): Payer: 59 | Admitting: Licensed Clinical Social Worker

## 2020-09-27 ENCOUNTER — Encounter (HOSPITAL_COMMUNITY): Payer: Self-pay

## 2020-09-27 ENCOUNTER — Other Ambulatory Visit: Payer: Self-pay

## 2020-09-27 NOTE — Telephone Encounter (Signed)
LCSW called pt to apologize for confusion r/t her appt this morning. Call went to vm. LCSW left detailed vm re purpose of call and provided info on next appt day/time.

## 2020-10-31 ENCOUNTER — Other Ambulatory Visit: Payer: Self-pay

## 2020-10-31 ENCOUNTER — Ambulatory Visit (INDEPENDENT_AMBULATORY_CARE_PROVIDER_SITE_OTHER): Payer: 59 | Admitting: Licensed Clinical Social Worker

## 2020-10-31 DIAGNOSIS — F418 Other specified anxiety disorders: Secondary | ICD-10-CM
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
## 2020-11-28 ENCOUNTER — Ambulatory Visit (HOSPITAL_COMMUNITY): Payer: 59 | Admitting: Licensed Clinical Social Worker

## 2021-01-06 ENCOUNTER — Ambulatory Visit (INDEPENDENT_AMBULATORY_CARE_PROVIDER_SITE_OTHER): Payer: 59 | Admitting: Licensed Clinical Social Worker

## 2021-01-06 ENCOUNTER — Other Ambulatory Visit: Payer: Self-pay

## 2021-01-06 DIAGNOSIS — F332 Major depressive disorder, recurrent severe without psychotic features: Secondary | ICD-10-CM | POA: Diagnosis not present

## 2021-01-08 NOTE — Progress Notes (Signed)
   THERAPIST PROGRESS NOTE  Session Time: 45 min  Participation Level: Minimal  Behavioral Response: CasualAlertDepressed  Type of Therapy: Individual Therapy  Treatment Goals addressed: Communication: Depression, coping  Interventions: Supportive and Other: Additional assessment  Summary: Mary Heath is a 49 y.o. female who presents with hx of dep/anx. This date pt returns for in person session. Last seen 10/31/20. Pt avoids eye contact throughout session. She has a slow, low, monotone speech pattern and is intermittently tearful throughout session. LCSW assessed for status of Godson on life support she advised of last session. Pt states he did survive and is currently in a rehab ctr in [place]. She continues to be worried about his overall recovery and being so far from fam support. Pt expresses much trouble with lack of motivation, isolating and spending most of every day in bed. Pt not doing well with day to day functioning and reports she has been denied again for disability. Reports her lawyer intends to appeal again. She is maintaining minimal family interactions and makes statements suggesting she does not believe they care about her. Pt feels no meaning/purpose. Pt is not able to say what meds she is taking. She states she takes them "sometimes". Assessed for pt's willingness to come to med eval at [facility]. Pt states she will come. Pt encouraged to bring meds with her for this appt. LCSW assessed for pt reading grief literature. Pt denies reading it and states at this point she does not know where it is. Pt continues to struggle with unresolved grief. She agrees to read lit if provided again, which was done. LCSW provided additional education r/t grief and impacts of unresolved grief. LCSW reviewed poc including scheduling prior to close of session. Pt states appreciation for care.   Suicidal/Homicidal: Yeswithout intent/plan  Therapist Response: Pt somewhat receptive to  care.  Plan: Return again for next avail appt.  Diagnosis: Axis I: Major Depression, Recurrent severe    [place] Sink, LCSW 01/08/2021

## 2021-01-14 ENCOUNTER — Ambulatory Visit: Payer: 59

## 2021-02-06 ENCOUNTER — Ambulatory Visit: Payer: 59

## 2021-02-11 ENCOUNTER — Ambulatory Visit (INDEPENDENT_AMBULATORY_CARE_PROVIDER_SITE_OTHER): Payer: 59 | Admitting: Licensed Clinical Social Worker

## 2021-02-11 ENCOUNTER — Other Ambulatory Visit: Payer: Self-pay

## 2021-02-11 DIAGNOSIS — F331 Major depressive disorder, recurrent, moderate: Secondary | ICD-10-CM | POA: Diagnosis not present

## 2021-02-15 ENCOUNTER — Ambulatory Visit
Admission: RE | Admit: 2021-02-15 | Discharge: 2021-02-15 | Disposition: A | Discharge status: Home / Self Care | Payer: 59 | Source: Ambulatory Visit | Attending: Nurse Practitioner | Admitting: Nurse Practitioner

## 2021-02-15 DIAGNOSIS — Z1231 Encounter for screening mammogram for malignant neoplasm of breast: Secondary | ICD-10-CM

## 2021-02-19 NOTE — Progress Notes (Signed)
   THERAPIST PROGRESS NOTE  Session Time: 40 min  Participation Level: Active  Behavioral Response: CasualAlertEuthymic  Type of Therapy: Individual Therapy  Treatment Goals addressed: Communication: dep/anx/coping  Interventions: Solution Focused and Supportive  Summary: Mary Heath is a 50 y.o. female who presents with hx of MDD. This date pt returns for in person session. Pt is noted to be moving with purpose, she is in an uplifted mood with appropriate smiles and laughter, good eye contact, no tears throughout session. Pt states she is "doing good". LCSW notes pt's improved mood and assesses for contributing factors. Pt reports she started a new job at [employer] working PT in [employer] ~ 2-3 wks ago. Pt reports lack of hope for disability, she states her mortgage on her [employer] home is $3000.00 behind and she is fearful of losing her home when she only has 4 more years to pay on home. LCSW again verbalizes the notable change in pt's mood. Pt agrees and further agrees it has been associated with her getting out of the house and having a purpose/schedule. LCSW commended pt for making the choice/step and encouraged her to continue. She does identify some stressors r/t other employees who have been there a long time trying to constantly tell her what to do. Addressed strategies to cope with this stressor. LCSW assessed for pt reading grief lit provided for a second time last session. Pt states "some". More detailed exploration reveals pt is mostly illiterate. LCSW provided support and education/referral to [facility] [employer]. Pt states intent to f/u so she can learn to read. She admits to the barriers this has created in her life. LCSW reviewed poc including scheduling prior to close of session. Pt states appreciation for care.    Suicidal/Homicidal: Nowithout intent/plan  Therapist Response: Pt receptive to care.  Plan: Return again in 4 weeks.  Diagnosis: Axis I:  MDD,  moderate  [place] Sink, LCSW 02/19/2021

## 2021-03-05 ENCOUNTER — Other Ambulatory Visit: Payer: Self-pay

## 2021-03-05 ENCOUNTER — Ambulatory Visit (INDEPENDENT_AMBULATORY_CARE_PROVIDER_SITE_OTHER): Payer: 59 | Admitting: Licensed Clinical Social Worker

## 2021-03-05 DIAGNOSIS — F331 Major depressive disorder, recurrent, moderate: Secondary | ICD-10-CM | POA: Diagnosis not present

## 2021-03-11 ENCOUNTER — Ambulatory Visit (HOSPITAL_COMMUNITY): Payer: 59 | Admitting: Physician Assistant
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
## 2021-04-21 ENCOUNTER — Telehealth (INDEPENDENT_AMBULATORY_CARE_PROVIDER_SITE_OTHER): Payer: 59 | Admitting: Psychiatry

## 2021-04-21 ENCOUNTER — Other Ambulatory Visit: Payer: Self-pay

## 2021-04-21 ENCOUNTER — Encounter (HOSPITAL_COMMUNITY): Payer: Self-pay | Admitting: Psychiatry

## 2021-04-21 DIAGNOSIS — F410 Panic disorder [episodic paroxysmal anxiety] without agoraphobia: Secondary | ICD-10-CM | POA: Diagnosis not present

## 2021-04-21 DIAGNOSIS — F332 Major depressive disorder, recurrent severe without psychotic features: Secondary | ICD-10-CM | POA: Diagnosis not present

## 2021-04-21 DIAGNOSIS — F411 Generalized anxiety disorder: Secondary | ICD-10-CM | POA: Diagnosis not present

## 2021-04-21 MED ORDER — FLUOXETINE HCL 20 MG PO CAPS: 20.0000 mg | ORAL_CAPSULE | Freq: Every day | ORAL | 2 refills | Status: DC

## 2021-04-21 MED ORDER — DOXEPIN HCL 10 MG PO CAPS: 10.0000 mg | ORAL_CAPSULE | Freq: Every day | ORAL | 2 refills | Status: DC

## 2021-04-21 NOTE — Progress Notes (Signed)
Psychiatric Initial Adult Assessment   Patient Identification: Mary Heath MRN:  [id number] Date of Evaluation:  04/21/2021 Referral Source: therapist Chief Complaint:   Chief Complaint   Anxiety; Depression; Establish Care    Visit Diagnosis:    ICD-10-CM   1. Severe episode of recurrent major depressive disorder, without psychotic features (HCC)  F33.2     2. Generalized anxiety disorder  F41.1     3. Panic disorder  F41.0       History of Present Illness:   50 yo female presents with depression and anxiety.  Her depression is high, no current suicidal ideations, last passive suicidal ideations a month ago.  Denies past attempts, endorses feelings of worthlessness. The anniversary of her son's death from cerebral palsy was recent and increased her depression. Reports him dying in her arms as traumatic with flashbacks at times. High anxiety with 2-3 panic attacks per week.  Poor sleep, "my whole life", Trazodone is not helping, fatigued most of the day.  Appetite fluctuates, no recent weight loss or gain. Denies nicotine, alcohol/drug use, hallucinations, and mania.  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  Four older children and 7 grandchildren.  Her children live close to her and are supportive.    Associated Signs/Symptoms: Depression Symptoms:  depressed mood, insomnia, fatigue, feelings of worthlessness/guilt, (Hypo) Manic Symptoms:   none Anxiety Symptoms:  Excessive Worry, Panic Symptoms, Psychotic Symptoms:   none PTSD Symptoms: Had a traumatic exposure:  son dying 13 years ago in her arms, CP  Past Psychiatric History: depression, anxiety, insomnia  Previous Psychotropic Medications: No   Substance Abuse History in the last 12 months:  No.  Consequences of Substance Abuse: NA  Past Medical History:  Past Medical History:  Diagnosis Date   Depression    HSV-2 infection    Hyperlipidemia    Umbilical hernia    Varicose veins of both lower extremities      Past Surgical History:  Procedure Laterality Date   TUBAL LIGATION      Family Psychiatric History: none  Family History:  Family History  Problem Relation Age of Onset   Breast cancer Mother     Social History:   Social History   Socioeconomic History   Marital status: Single    Spouse name: Not on file   Number of children: Not on file   Years of education: Not on file   Highest education level: Not on file  Occupational History   Not on file  Tobacco Use   Smoking status: Never   Smokeless tobacco: Never  Vaping Use   Vaping Use: Never used  Substance and Sexual Activity   Alcohol use: No   Drug use: No   Sexual activity: Never    Birth control/protection: Surgical  Other Topics Concern   Not on file  Social History Narrative   Not on file   Social Determinants of Health   Financial Resource Strain: Not on file  Food Insecurity: Not on file  Transportation Needs: Not on file  Physical Activity: Not on file  Stress: Not on file  Social Connections: Not on file    Additional Social History: lives alone with supportive family nearby  Allergies:  No Known Allergies  Metabolic Disorder Labs: No results found for: HGBA1C, MPG No results found for: PROLACTIN Lab Results  Component Value Date   CHOL 209 (H) 06/06/2009   TRIG 85 06/06/2009   HDL 50 06/06/2009   CHOLHDL 4.2 Ratio  06/06/2009   VLDL 17 06/06/2009   LDLCALC 142 (H) 06/06/2009   LDLCALC 132 (H) 12/28/2007   Lab Results  Component Value Date   TSH 0.880 06/06/2009    Therapeutic Level Labs: No results found for: LITHIUM No results found for: CBMZ No results found for: VALPROATE  Current Medications: Current Outpatient Medications  Medication Sig Dispense Refill   doxepin (SINEQUAN) 10 MG capsule Take 1 capsule (10 mg total) by mouth at bedtime. 30 capsule 2   FLUoxetine (PROZAC) 20 MG capsule Take 1 capsule (20 mg total) by mouth daily. 30 capsule 2   gabapentin (NEURONTIN)  300 MG capsule Take 1 capsule by mouth 3 (three) times daily.     lidocaine (LIDODERM) 5 % Place 1 patch onto the skin daily. Remove & Discard patch within 12 hours or as directed by MD 30 patch 0   metFORMIN (GLUCOPHAGE-XR) 500 MG 24 hr tablet SMARTSIG:1 Tablet(s) By Mouth Every Evening     No current facility-administered medications for this visit.    Musculoskeletal: Strength & Muscle Tone: within normal limits Gait & Station: normal Patient leans: N/A  Psychiatric Specialty Exam: Review of Systems  Psychiatric/Behavioral:  Positive for dysphoric mood and sleep disturbance. The patient is nervous/anxious.   All other systems reviewed and are negative.  Blood pressure (!) 153/100, pulse 93, height 5\' 3"  (1.6 m), weight 270 lb (122.5 kg).Body mass index is 47.83 kg/m.  General Appearance: Casual  Eye Contact:  Good  Speech:  Normal Rate  Volume:  Normal  Mood:  Anxious and Depressed  Affect:  Congruent  Thought Process:  Coherent and Descriptions of Associations: Intact  Orientation:  Full (Time, Place, and Person)  Thought Content:  WDL and Logical  Suicidal Thoughts:  No  Homicidal Thoughts:  No  Memory:  Immediate;   Good Recent;   Good Remote;   Good  Judgement:  Good  Insight:  Good  Psychomotor Activity:  Normal  Concentration:  Concentration: Good and Attention Span: Good  Recall:  Good  Fund of Knowledge:Good  Language: Good  Akathisia:  No  Handed:  Right  AIMS (if indicated):  not done  Assets:  Housing Leisure Time Physical Health Resilience Social Support  ADL's:  Intact  Cognition: WNL  Sleep:  Poor   Screenings: PHQ2-9    Flowsheet Row Video Visit from 04/21/2021 in [facility] from 07/17/2020 in [facility]  PHQ-2 Total Score 6 2  PHQ-9 Total Score 20 12      Flowsheet Row Video Visit from 04/21/2021 in [facility] from [facility]/20/2020 [facility] from 07/17/2020 in [facility]  C-SSRS RISK CATEGORY No Risk No Risk No Risk       Assessment and Plan:  Major depressive disorder, recurrent, severe; general anxiety disorder: -Started Prozac 20 mg daily Follow up in 6 weeks  Insomnia: -Discontinue Trazodone -Start Doxepin 10 mg daily at bedtime  Virtual Visit via Video Note  I connected with [facility] on 04/21/21 at  8:30 AM EST by a video enabled telemedicine application and verified that I am speaking with the correct person using two identifiers.  Location: Patient: [facility] Provider: home office   I discussed the limitations of evaluation and management by telemedicine and the availability of in person appointments. The patient expressed understanding and agreed to proceed.   Follow Up Instructions: Follow up in 6  weeks   I discussed the assessment and treatment plan with the patient. The patient was provided an opportunity to ask questions and all were answered. The patient agreed with the plan and demonstrated an understanding of the instructions.   The patient was advised to call back or seek an in-person evaluation if the symptoms worsen or if the condition fails to improve as anticipated.  I provided 45 minutes of non-face-to-face time during this encounter.   Nanine Means, NP    Nanine Means, NP 11/28/20229:01 AM

## 2021-04-23 ENCOUNTER — Ambulatory Visit (INDEPENDENT_AMBULATORY_CARE_PROVIDER_SITE_OTHER): Payer: 59 | Admitting: Licensed Clinical Social Worker

## 2021-04-23 DIAGNOSIS — F330 Major depressive disorder, recurrent, mild: Secondary | ICD-10-CM | POA: Diagnosis not present
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
## 2021-05-09 ENCOUNTER — Encounter (HOSPITAL_COMMUNITY): Payer: Self-pay

## 2021-05-21 ENCOUNTER — Ambulatory Visit (HOSPITAL_COMMUNITY): Payer: 59 | Admitting: Licensed Clinical Social Worker

## 2021-06-02 ENCOUNTER — Telehealth (HOSPITAL_COMMUNITY): Payer: 59 | Admitting: Psychiatry

## 2021-06-03 ENCOUNTER — Telehealth (HOSPITAL_COMMUNITY): Payer: 59 | Admitting: Psychiatry

## 2021-06-03 ENCOUNTER — Encounter (HOSPITAL_COMMUNITY): Payer: Self-pay

## 2021-06-03 ENCOUNTER — Other Ambulatory Visit (HOSPITAL_COMMUNITY): Payer: Self-pay | Admitting: Psychiatry

## 2021-06-03 MED ORDER — DOXEPIN HCL 10 MG PO CAPS: 10.0000 mg | ORAL_CAPSULE | Freq: Every day | ORAL | 2 refills | Status: DC

## 2021-06-03 MED ORDER — FLUOXETINE HCL 20 MG PO CAPS: 20.0000 mg | ORAL_CAPSULE | Freq: Every day | ORAL | 2 refills | Status: DC

## 2021-06-04 ENCOUNTER — Other Ambulatory Visit: Payer: Self-pay

## 2021-06-04 ENCOUNTER — Emergency Department (HOSPITAL_BASED_OUTPATIENT_CLINIC_OR_DEPARTMENT_OTHER)
Admission: EM | Admit: 2021-06-04 | Discharge: 2021-06-04 | Disposition: A | Discharge status: Home / Self Care | Payer: Self-pay | Attending: Emergency Medicine | Admitting: Emergency Medicine

## 2021-06-04 ENCOUNTER — Encounter (HOSPITAL_BASED_OUTPATIENT_CLINIC_OR_DEPARTMENT_OTHER): Payer: Self-pay | Admitting: Emergency Medicine

## 2021-06-04 DIAGNOSIS — M79604 Pain in right leg: Secondary | ICD-10-CM | POA: Insufficient documentation

## 2021-06-04 DIAGNOSIS — M79601 Pain in right arm: Secondary | ICD-10-CM | POA: Insufficient documentation

## 2021-06-04 DIAGNOSIS — Y92410 Unspecified street and highway as the place of occurrence of the external cause: Secondary | ICD-10-CM | POA: Insufficient documentation

## 2021-06-04 DIAGNOSIS — M542 Cervicalgia: Secondary | ICD-10-CM | POA: Insufficient documentation

## 2021-06-04 MED ORDER — METHOCARBAMOL 500 MG PO TABS: 1000.0000 mg | ORAL_TABLET | Freq: Two times a day (BID) | ORAL | 0 refills | Status: DC

## 2021-06-04 MED ORDER — HYDROCODONE-ACETAMINOPHEN 5-325 MG PO TABS: 1.0000 | ORAL_TABLET | ORAL | 0 refills | Status: DC | PRN
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
## 2021-06-04 NOTE — ED Notes (Signed)
EMT-P provided AVS using Teachback Method. Patient verbalizes understanding of Discharge Instructions. Opportunity for Questioning and Answers were provided by EMT-P. Patient Discharged from ED.  ? ?
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
## 2021-07-02 ENCOUNTER — Telehealth (HOSPITAL_COMMUNITY): Payer: Self-pay | Admitting: Licensed Clinical Social Worker

## 2021-07-02 ENCOUNTER — Other Ambulatory Visit: Payer: Self-pay

## 2021-07-02 ENCOUNTER — Encounter (HOSPITAL_COMMUNITY): Payer: Self-pay

## 2021-07-02 ENCOUNTER — Ambulatory Visit (HOSPITAL_COMMUNITY): Payer: 59 | Admitting: Licensed Clinical Social Worker
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
## 2021-07-15 ENCOUNTER — Telehealth (HOSPITAL_COMMUNITY): Payer: No Payment, Other | Admitting: Psychiatry

## 2021-07-22 ENCOUNTER — Encounter (HOSPITAL_COMMUNITY): Payer: Self-pay | Admitting: Psychiatry

## 2021-07-22 ENCOUNTER — Telehealth (INDEPENDENT_AMBULATORY_CARE_PROVIDER_SITE_OTHER): Payer: 59 | Admitting: Psychiatry

## 2021-07-22 DIAGNOSIS — F332 Major depressive disorder, recurrent severe without psychotic features: Secondary | ICD-10-CM

## 2021-07-22 MED ORDER — FLUOXETINE HCL 40 MG PO CAPS: 40.0000 mg | ORAL_CAPSULE | Freq: Every day | ORAL | 2 refills | Status: DC

## 2021-07-22 MED ORDER — MIRTAZAPINE 15 MG PO TBDP: 7.5000 mg | ORAL_TABLET | Freq: Every day | ORAL | 2 refills | Status: DC

## 2021-07-22 NOTE — Progress Notes (Signed)
[facility] NP OP Progress Note  Patient Identification: Mary Heath MRN:  [id number] Date of Evaluation:  07/22/2021 Referral Source: therapist Chief Complaint:   Chief Complaint   Anxiety; Depression; Follow-up    Visit Diagnosis:    ICD-10-CM   1. Severe episode of recurrent major depressive disorder, without psychotic features ([place])  F33.2       History of Present Illness:   51 yo female presents with depression and anxiety.  Her depression is high this week at a 7-8/10, with 10 being the highest, because it is the birthday anniversary of her deceased son, no suicidal ideations.  Her depression was better until this week with 2-3 days of depression per week.  Anxiety is moderate, sleep continues to be an issue, Doxepin, is not working.  Her appetite is fair.  Trazodone also did not work.  Remeron started with an increase in her Prozac.  The stress of work is increasing her symptoms.  Support and relaxation make things better.  Denies nicotine, alcohol/drug use, hallucinations, and mania.  She works at [employer] as a Training and development officer.  Four older children and 7 grandchildren.  Her children live close to her and continue to be supportive.    Associated Signs/Symptoms: Depression Symptoms:  depressed mood, insomnia, fatigue, feelings of worthlessness/guilt, (Hypo) Manic Symptoms:   none Anxiety Symptoms:  Excessive Worry, Panic Symptoms, Psychotic Symptoms:   none PTSD Symptoms: Had a traumatic exposure:  son dying 75 years ago in her arms, CP  Past Psychiatric History: depression, anxiety, insomnia  Previous Psychotropic Medications: No    Past Medical History:  Past Medical History:  Diagnosis Date   Depression    HSV-2 infection    Hyperlipidemia    Umbilical hernia    Varicose veins of both lower extremities     Past Surgical History:  Procedure Laterality Date   TUBAL LIGATION      Family Psychiatric History: none  Family History:  Family History  Problem Relation  Age of Onset   Breast cancer Mother     Social History:   Social History   Socioeconomic History   Marital status: Single    Spouse name: Not on file   Number of children: Not on file   Years of education: Not on file   Highest education level: Not on file  Occupational History   Not on file  Tobacco Use   Smoking status: Never   Smokeless tobacco: Never  Vaping Use   Vaping Use: Never used  Substance and Sexual Activity   Alcohol use: No   Drug use: No   Sexual activity: Never    Birth control/protection: Surgical  Other Topics Concern   Not on file  Social History Narrative   Not on file   Social Determinants of Health   Financial Resource Strain: Not on file  Food Insecurity: Not on file  Transportation Needs: Not on file  Physical Activity: Not on file  Stress: Not on file  Social Connections: Not on file    Additional Social History: lives alone with supportive family nearby  Allergies:  No Known Allergies  Metabolic Disorder Labs: No results found for: HGBA1C, MPG No results found for: PROLACTIN Lab Results  Component Value Date   CHOL 209 (H) 06/06/2009   TRIG 85 06/06/2009   HDL 50 06/06/2009   CHOLHDL 4.2 Ratio 06/06/2009   VLDL 17 06/06/2009   LDLCALC 142 (H) 06/06/2009   LDLCALC 132 (H) 12/28/2007   Lab Results  Component Value Date   TSH 0.880 06/06/2009    Therapeutic Level Labs: No results found for: LITHIUM No results found for: CBMZ No results found for: VALPROATE  Current Medications: Current Outpatient Medications  Medication Sig Dispense Refill   FLUoxetine (PROZAC) 40 MG capsule Take 1 capsule (40 mg total) by mouth daily. 30 capsule 2   mirtazapine (REMERON SOL-TAB) 15 MG disintegrating tablet Take 0.5 tablets (7.5 mg total) by mouth at bedtime. 30 tablet 2   gabapentin (NEURONTIN) 300 MG capsule Take 1 capsule by mouth 3 (three) times daily.     HYDROcodone-acetaminophen (NORCO/VICODIN) 5-325 MG tablet Take 1-2 tablets by  mouth every 4 (four) hours as needed. 10 tablet 0   lidocaine (LIDODERM) 5 % Place 1 patch onto the skin daily. Remove & Discard patch within 12 hours or as directed by MD 30 patch 0   metFORMIN (GLUCOPHAGE-XR) 500 MG 24 hr tablet SMARTSIG:1 Tablet(s) By Mouth Every Evening     methocarbamol (ROBAXIN) 500 MG tablet Take 2 tablets (1,000 mg total) by mouth 2 (two) times daily. 20 tablet 0   No current facility-administered medications for this visit.    Musculoskeletal: Strength & Muscle Tone: denies issues Gait & Station: denies issues Patient leans: denies issues  Psychiatric Specialty Exam: Review of Systems  Psychiatric/Behavioral:  Positive for dysphoric mood and sleep disturbance. The patient is nervous/anxious.   All other systems reviewed and are negative.  There were no vitals taken for this visit.There is no height or weight on file to calculate BMI.  General Appearance: UTA, telephone assessment  Eye Contact:  UTA  Speech:  Normal Rate  Volume:  Normal  Mood:  Anxious and Depressed  Affect:  UTA  Thought Process:  Coherent and Descriptions of Associations: Intact  Orientation:  Full (Time, Place, and Person)  Thought Content:  WDL and Logical  Suicidal Thoughts:  No  Homicidal Thoughts:  No  Memory:  Immediate;   Good Recent;   Good Remote;   Good  Judgement:  Good  Insight:  Good  Psychomotor Activity:  Normal  Concentration:  Concentration: Good and Attention Span: Good  Recall:  Good  Fund of Knowledge:Good  Language: Good  Akathisia:  No  Handed:  Right  AIMS (if indicated):  not done  Assets:  Housing Leisure Time Physical Health Resilience Social Support  ADL's:  Intact  Cognition: WNL  Sleep:  Poor   Screenings: PHQ2-9    Flowsheet Row Video Visit from 04/21/2021 in [facility]or from 07/17/2020 in [facility]  PHQ-2 Total Score 6 2  PHQ-9 Total Score 20 12      Flowsheet Row  ED from 06/04/2021 in [facility] Video Visit from 04/21/2021 in [facility] ED from 09/20/2020 in [place] No Risk No Risk No Risk       Assessment and Plan:  Major depressive disorder, recurrent, severe; general anxiety disorder: -Increased Prozac 20 mg daily to 40 mg daily Continue therapy  Insomnia: -Discontinue Doxepin -Started Remeron 7.5 mg daily at bedtime  Virtual Visit via Telephone Note  I connected with Mary Heath on 07/22/21 at  9:00 AM EST by telephone and verified that I am speaking with the correct person using two identifiers.  Location: Patient: work Provider: home office   I discussed the limitations, risks, security and privacy concerns of performing an evaluation and management service  by telephone and the availability of in person appointments. I also discussed with the patient that there may be a patient responsible charge related to this service. The patient expressed understanding and agreed to proceed.  Follow Up Instructions: Follow up in 3 months   I discussed the assessment and treatment plan with the patient. The patient was provided an opportunity to ask questions and all were answered. The patient agreed with the plan and demonstrated an understanding of the instructions.   The patient was advised to call back or seek an in-person evaluation if the symptoms worsen or if the condition fails to improve as anticipated.  I provided 10 minutes of non-face-to-face time during this encounter.   Waylan Boga, NP    Waylan Boga, NP    Waylan Boga, NP 2/28/20239:17 AM

## 2021-07-26 ENCOUNTER — Ambulatory Visit (HOSPITAL_COMMUNITY)
Admission: EM | Admit: 2021-07-26 | Discharge: 2021-07-26 | Disposition: A | Discharge status: Home / Self Care | Payer: PRIVATE HEALTH INSURANCE | Attending: Emergency Medicine | Admitting: Emergency Medicine

## 2021-07-26 ENCOUNTER — Encounter (HOSPITAL_COMMUNITY): Payer: Self-pay | Admitting: *Deleted

## 2021-07-26 ENCOUNTER — Other Ambulatory Visit: Payer: Self-pay

## 2021-07-26 DIAGNOSIS — S025XXB Fracture of tooth (traumatic), initial encounter for open fracture: Secondary | ICD-10-CM

## 2021-07-26 DIAGNOSIS — K047 Periapical abscess without sinus: Secondary | ICD-10-CM

## 2021-07-26 MED ORDER — IBUPROFEN 800 MG PO TABS: 800.0000 mg | ORAL_TABLET | Freq: Three times a day (TID) | ORAL | 0 refills | Status: DC | PRN

## 2021-07-26 MED ORDER — PENICILLIN V POTASSIUM 500 MG PO TABS: 500.0000 mg | ORAL_TABLET | Freq: Three times a day (TID) | ORAL | 0 refills | Status: AC

## 2021-07-26 MED ORDER — IBUPROFEN 800 MG PO TABS
ORAL_TABLET | ORAL | Status: AC
  Filled 2021-07-26: qty 1

## 2021-07-26 MED ORDER — IBUPROFEN 800 MG PO TABS
800.0000 mg | ORAL_TABLET | Freq: Once | ORAL | Status: AC
  Administered 2021-07-26: 800 mg via ORAL

## 2021-07-26 NOTE — Discharge Instructions (Addendum)
Please reach out to one of the low-cost dentists listed on the handout that I provided you today.  The tooth that is infected at this time, will need to be addressed once the infection has cleared.  If it is not, the infection will return.  Dental infections increase your risk of infections migrating into the valves in your heart which can lead to serious illness and death. ? ?To treat your dental infection today, please begin taking penicillin, 1 tablet 3 times daily for the next 14 days.  It is important that you finish all of this prescription exactly as prescribed and do not skip doses.  Please do not discontinue this antibiotic treatment even though your tooth is feeling completely better.  Incomplete antibiotic treatment increases your risk of more severe and more resistant bacterial infections. ? ?For pain, you are provided with 800 mg of ibuprofen during your visit today and a prescription for the same.  You may take this medication 3 times daily as needed for pain.  Once the antibiotics have begun to work, you will find your pain significantly improves on its own. ? ?Thank you for visiting urgent care today. ?

## 2021-07-26 NOTE — ED Triage Notes (Signed)
Pt reports a broken tooth on Rt side of mouth and sever pain. ?

## 2021-07-26 NOTE — ED Provider Notes (Signed)
[facility]    CSN: [id number] Arrival date & time: 07/26/21  1459    HISTORY   Chief Complaint  Patient presents with   Dental Problem   HPI Mary Heath is a 51 y.o. female. Patient reports a broken tooth on the bottom right side of her mouth that is causing her severe pain.  Patient states she also has swelling of her chin and cheek around the same area.  Patient reports multiple missing teeth and poor dentition, states she currently does not have a Education officer, community or dental insurance.  Patient denies fever, aches, chills.  Patient denies ear pain.  The history is provided by the patient.  Past Medical History:  Diagnosis Date   Depression    HSV-2 infection    Hyperlipidemia    Umbilical hernia    Varicose veins of both lower extremities    Patient Active Problem List   Diagnosis Date Noted   Major depressive disorder, recurrent episode, severe (HCC) 04/21/2021   Generalized anxiety disorder 04/21/2021   Panic disorder 04/21/2021   BACK PAIN 01/14/2010   HSV 06/20/2009   HYPERCHOLESTEROLEMIA 06/19/2009   VARICOSE VEINS, LOWER EXTREMITIES 06/19/2009   LEG PAIN, LEFT 06/06/2009   TRICHOMONAL VAGINITIS 12/28/2007   ONYCHOMYCOSIS 12/05/2007   OBESITY 12/05/2007   UMBILICAL HERNIA 12/05/2007   Past Surgical History:  Procedure Laterality Date   TUBAL LIGATION     OB History     Gravida  5   Para  5   Term  5   Preterm      AB      Living  5      SAB      IAB      Ectopic      Multiple      Live Births             Home Medications    Prior to Admission medications   Medication Sig Start Date End Date Taking? Authorizing Provider  ibuprofen (ADVIL) 800 MG tablet Take 1 tablet (800 mg total) by mouth every 8 (eight) hours as needed for up to 21 doses for fever, headache, mild pain or moderate pain. 07/26/21  Yes Theadora Rama Scales, PA-C  penicillin v potassium (VEETID) 500 MG tablet Take 1 tablet (500 mg total) by mouth 3 (three)  times daily for 14 days. 07/26/21 08/09/21 Yes Theadora Rama Scales, PA-C  FLUoxetine (PROZAC) 40 MG capsule Take 1 capsule (40 mg total) by mouth daily. 07/22/21 08/21/21  Charm Rings, NP  gabapentin (NEURONTIN) 300 MG capsule Take 1 capsule by mouth 3 (three) times daily. 07/18/20   [provider]  lidocaine (LIDODERM) 5 % Place 1 patch onto the skin daily. Remove & Discard patch within 12 hours or as directed by MD 09/21/20   Nicanor Alcon, April, MD  metFORMIN (GLUCOPHAGE-XR) 500 MG 24 hr tablet SMARTSIG:1 Tablet(s) By Mouth Every Evening 07/09/20   [provider]  methocarbamol (ROBAXIN) 500 MG tablet Take 2 tablets (1,000 mg total) by mouth 2 (two) times daily. 06/04/21   Lorre Nick, MD  mirtazapine (REMERON SOL-TAB) 15 MG disintegrating tablet Take 0.5 tablets (7.5 mg total) by mouth at bedtime. 07/22/21 08/21/21  Charm Rings, NP  omeprazole (PRILOSEC) 20 MG capsule Take 1 capsule (20 mg total) by mouth daily. Patient not taking: Reported on 05/17/2018 09/11/17 04/25/20  Arby Barrette, MD   Family History Family History  Problem Relation Age of Onset   Breast cancer Mother  Social History Social History   Tobacco Use   Smoking status: Never   Smokeless tobacco: Never  Vaping Use   Vaping Use: Never used  Substance Use Topics   Alcohol use: No   Drug use: No   Allergies   Patient has no known allergies.  Review of Systems Review of Systems Pertinent findings noted in history of present illness.   Physical Exam Triage Vital Signs ED Triage Vitals  Enc Vitals Group     BP 03/21/21 0827 (!) 147/82     Pulse Rate 03/21/21 0827 72     Resp 03/21/21 0827 18     Temp 03/21/21 0827 98.3 F (36.8 C)     Temp Source 03/21/21 0827 Oral     SpO2 03/21/21 0827 98 %     Weight --      Height --      Head Circumference --      Peak Flow --      Pain Score 03/21/21 0826 5     Pain Loc --      Pain Edu? --      Excl. in GC? --   No data found.  Updated  Vital Signs BP (!) 152/88    Pulse 87    Temp 97.8 F (36.6 C)    Resp 16    SpO2 98%   Physical Exam Vitals and nursing note reviewed.  Constitutional:      General: She is not in acute distress.    Appearance: Normal appearance. She is not ill-appearing.  HENT:     Head: Normocephalic and atraumatic.     Salivary Glands: Right salivary gland is not diffusely enlarged or tender. Left salivary gland is not diffusely enlarged or tender.     Right Ear: Tympanic membrane, ear canal and external ear normal. No drainage. No middle ear effusion. There is no impacted cerumen. Tympanic membrane is not erythematous or bulging.     Left Ear: Tympanic membrane, ear canal and external ear normal. No drainage.  No middle ear effusion. There is no impacted cerumen. Tympanic membrane is not erythematous or bulging.     Nose: Nose normal. No nasal deformity, septal deviation, mucosal edema, congestion or rhinorrhea.     Right Turbinates: Not enlarged, swollen or pale.     Left Turbinates: Not enlarged, swollen or pale.     Right Sinus: No maxillary sinus tenderness or frontal sinus tenderness.     Left Sinus: No maxillary sinus tenderness or frontal sinus tenderness.     Mouth/Throat:     Lips: Pink. No lesions.     Mouth: Mucous membranes are moist. No oral lesions.     Dentition: Abnormal dentition. Does not have dentures. Dental tenderness, dental caries and dental abscesses present.     Pharynx: Oropharynx is clear. Uvula midline. No posterior oropharyngeal erythema or uvula swelling.     Tonsils: No tonsillar exudate. 0 on the right. 0 on the left.  Eyes:     General: Lids are normal.        Right eye: No discharge.        Left eye: No discharge.     Extraocular Movements: Extraocular movements intact.     Conjunctiva/sclera: Conjunctivae normal.     Right eye: Right conjunctiva is not injected.     Left eye: Left conjunctiva is not injected.  Neck:     Trachea: Trachea and phonation normal.   Cardiovascular:     Rate and  Rhythm: Normal rate and regular rhythm.     Pulses: Normal pulses.     Heart sounds: Normal heart sounds. No murmur heard.   No friction rub. No gallop.  Pulmonary:     Effort: Pulmonary effort is normal. No accessory muscle usage, prolonged expiration or respiratory distress.     Breath sounds: Normal breath sounds. No stridor, decreased air movement or transmitted upper airway sounds. No decreased breath sounds, wheezing, rhonchi or rales.  Chest:     Chest wall: No tenderness.  Musculoskeletal:        General: Normal range of motion.     Cervical back: Normal range of motion and neck supple. Normal range of motion.  Lymphadenopathy:     Cervical: No cervical adenopathy.  Skin:    General: Skin is warm and dry.     Findings: No erythema or rash.  Neurological:     General: No focal deficit present.     Mental Status: She is alert and oriented to person, place, and time.  Psychiatric:        Mood and Affect: Mood normal.        Behavior: Behavior normal.    Visual Acuity Right Eye Distance:   Left Eye Distance:   Bilateral Distance:    Right Eye Near:   Left Eye Near:    Bilateral Near:     UC Couse / Diagnostics / Procedures:    EKG  Radiology No results found.  Procedures Procedures (including critical care time)  UC Diagnoses / Final Clinical Impressions(s)   I have reviewed the triage vital signs and the nursing notes.  Pertinent labs & imaging results that were available during my care of the patient were reviewed by me and considered in my medical decision making (see chart for details).   Final diagnoses:  Open fracture of tooth, initial encounter  Dental infection  Dental abscess   Patient provided with a prescription for penicillin and a list of low-cost dental providers in this area.  Patient strongly encouraged to have her teeth addressed to avoid risk of bacterial infection or heart.  Patient also advised to follow-up  with primary care provider regarding her blood pressure.  Return precautions advised.  ED Prescriptions     Medication Sig Dispense Auth. Provider   penicillin v potassium (VEETID) 500 MG tablet Take 1 tablet (500 mg total) by mouth 3 (three) times daily for 14 days. 42 tablet Theadora Rama Scales, PA-C   ibuprofen (ADVIL) 800 MG tablet Take 1 tablet (800 mg total) by mouth every 8 (eight) hours as needed for up to 21 doses for fever, headache, mild pain or moderate pain. 21 tablet Theadora Rama Scales, PA-C      PDMP not reviewed this encounter.  Pending results:  Labs Reviewed - No data to display  Medications Ordered in UC: Medications  ibuprofen (ADVIL) tablet 800 mg (has no administration in time range)    Disposition Upon Discharge:  Condition: stable for discharge home Home: take medications as prescribed; routine discharge instructions as discussed; follow up as advised.  Patient presented with an acute illness with associated systemic symptoms and significant discomfort requiring urgent management. In my opinion, this is a condition that a prudent lay person (someone who possesses an average knowledge of health and medicine) may potentially expect to result in complications if not addressed urgently such as respiratory distress, impairment of bodily function or dysfunction of bodily organs.   Routine symptom specific, illness specific and/or  disease specific instructions were discussed with the patient and/or caregiver at length.   As such, the patient has been evaluated and assessed, work-up was performed and treatment was provided in alignment with urgent care protocols and evidence based medicine.  Patient/parent/caregiver has been advised that the patient may require follow up for further testing and treatment if the symptoms continue in spite of treatment, as clinically indicated and appropriate.  If the patient was tested for COVID-19, Influenza and/or RSV, then the  patient/parent/guardian was advised to isolate at home pending the results of his/her diagnostic coronavirus test and potentially longer if theyre positive. I have also advised pt that if his/her COVID-19 test returns positive, it's recommended to self-isolate for at least 10 days after symptoms first appeared AND until fever-free for 24 hours without fever reducer AND other symptoms have improved or resolved. Discussed self-isolation recommendations as well as instructions for household member/close contacts as per the [facility] and [place] DHHS, and also gave patient the COVID packet with this information.  Patient/parent/caregiver has been [facility] 3-5 days if no better; to PCP or the Emergency Department if new signs and symptoms develop, or if the current signs or symptoms continue to change or worsen for further workup, evaluation and treatment as clinically indicated and appropriate  The patient will follow up with their current PCP if and as advised. If the patient does not currently have a PCP we will assist them in obtaining one.   The patient may need specialty follow up if the symptoms continue, in spite of conservative treatment and management, for further workup, evaluation, consultation and treatment as clinically indicated and appropriate.  Patient/parent/caregiver verbalized understanding and agreement of plan as discussed.  All questions were addressed during visit.  Please see discharge instructions below for further details of plan.  Discharge Instructions:   Discharge Instructions      Please reach out to one of the low-cost dentists listed on the handout that I provided you today.  The tooth that is infected at this time, will need to be addressed once the infection has cleared.  If it is not, the infection will return.  Dental infections increase your risk of infections migrating into the valves in your heart which can lead to serious illness and  death.  To treat your dental infection today, please begin taking penicillin, 1 tablet 3 times daily for the next 14 days.  It is important that you finish all of this prescription exactly as prescribed and do not skip doses.  Please do not discontinue this antibiotic treatment even though your tooth is feeling completely better.  Incomplete antibiotic treatment increases your risk of more severe and more resistant bacterial infections.  For pain, you are provided with 800 mg of ibuprofen during your visit today and a prescription for the same.  You may take this medication 3 times daily as needed for pain.  Once the antibiotics have begun to work, you will find your pain significantly improves on its own.  Thank you for visiting urgent care today.    This office note has been dictated using Teaching laboratory technicianDragon speech recognition software.  Unfortunately, and despite my best efforts, this method of dictation can sometimes lead to occasional typographical or grammatical errors.  I apologize in advance if this occurs.     Theadora RamaMorgan, Sidney Silberman Scales, PA-C 07/26/21 1601

## 2021-09-17 ENCOUNTER — Ambulatory Visit (HOSPITAL_COMMUNITY): Payer: 59 | Admitting: Licensed Clinical Social Worker

## 2021-10-14 ENCOUNTER — Encounter (HOSPITAL_COMMUNITY): Payer: Self-pay

## 2021-10-14 ENCOUNTER — Telehealth (HOSPITAL_COMMUNITY): Payer: 59 | Admitting: Psychiatry

## 2021-10-15 ENCOUNTER — Ambulatory Visit (HOSPITAL_COMMUNITY): Payer: 59 | Admitting: Licensed Clinical Social Worker

## 2021-11-28 IMAGING — MG MM DIGITAL SCREENING BILAT W/ TOMO AND CAD
8 series · 8 of 24 positions shown · non-contrast
Comparison: Previous exam(s).

ACR Breast Density Category a: The breast tissue is almost entirely
fatty.

CLINICAL DATA: Screening.

EXAM:
DIGITAL SCREENING BILATERAL MAMMOGRAM WITH TOMOSYNTHESIS AND CAD
TECHNIQUE: Bilateral screening digital craniocaudal and mediolateral oblique
mammograms were obtained. Bilateral screening digital breast
tomosynthesis was performed. The images were evaluated with
computer-aided detection.

[L MLO synth-2D]
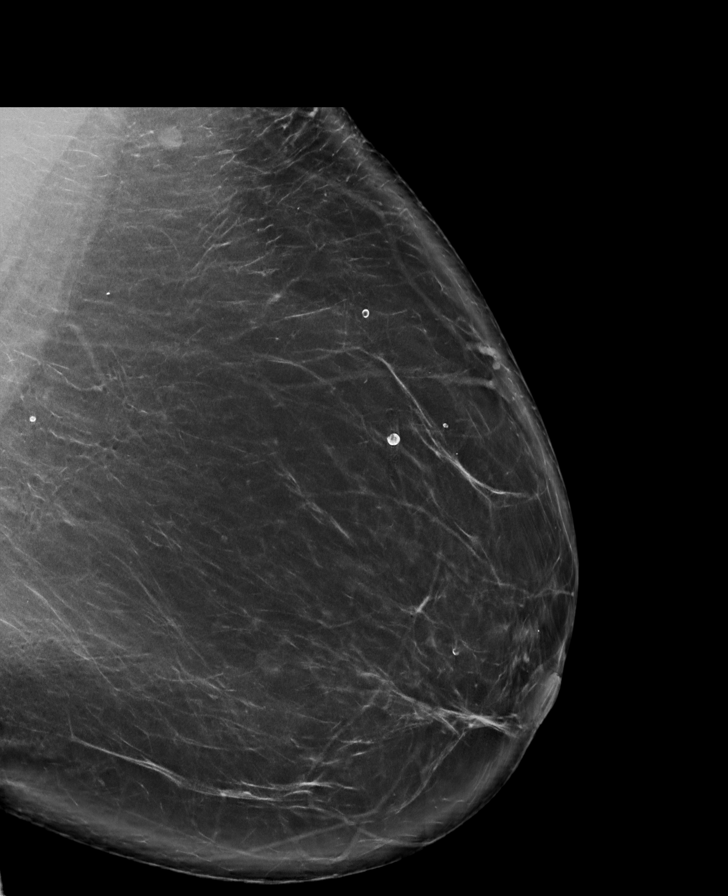

[R CC synth-2D]
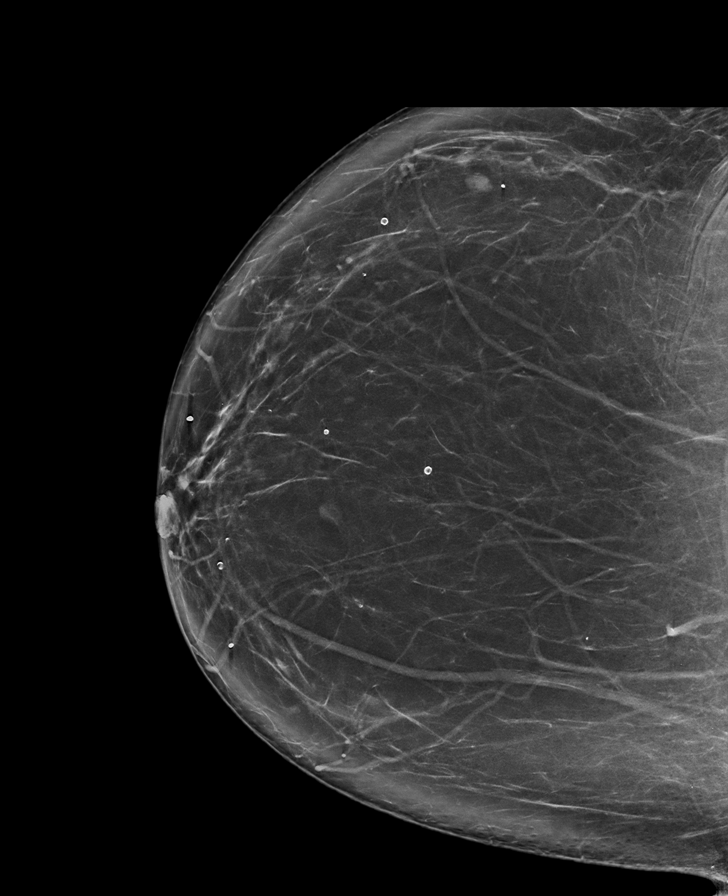

[L CC synth-2D]
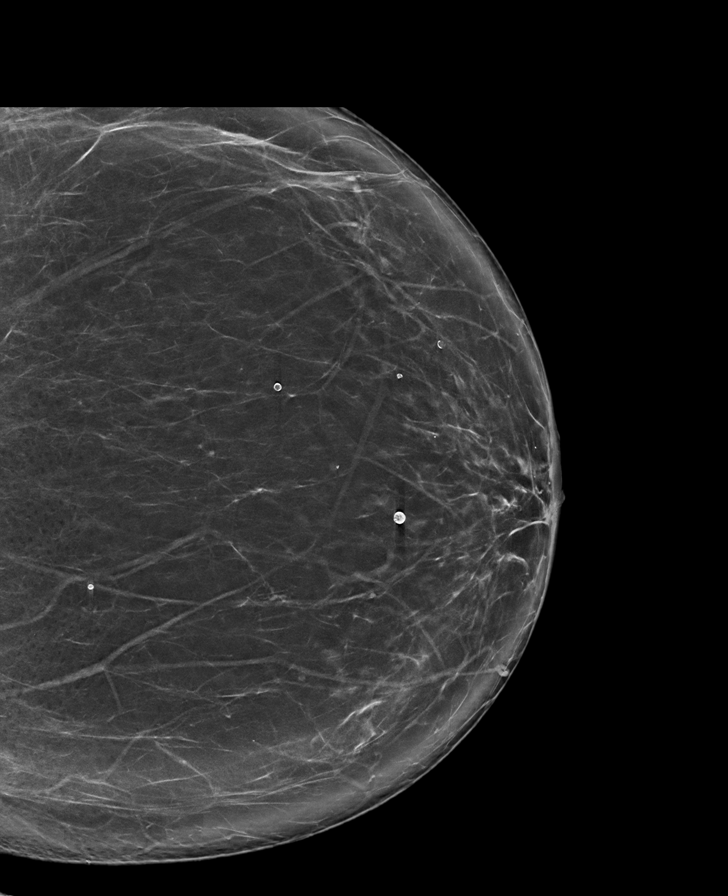

[R MLO synth-2D]
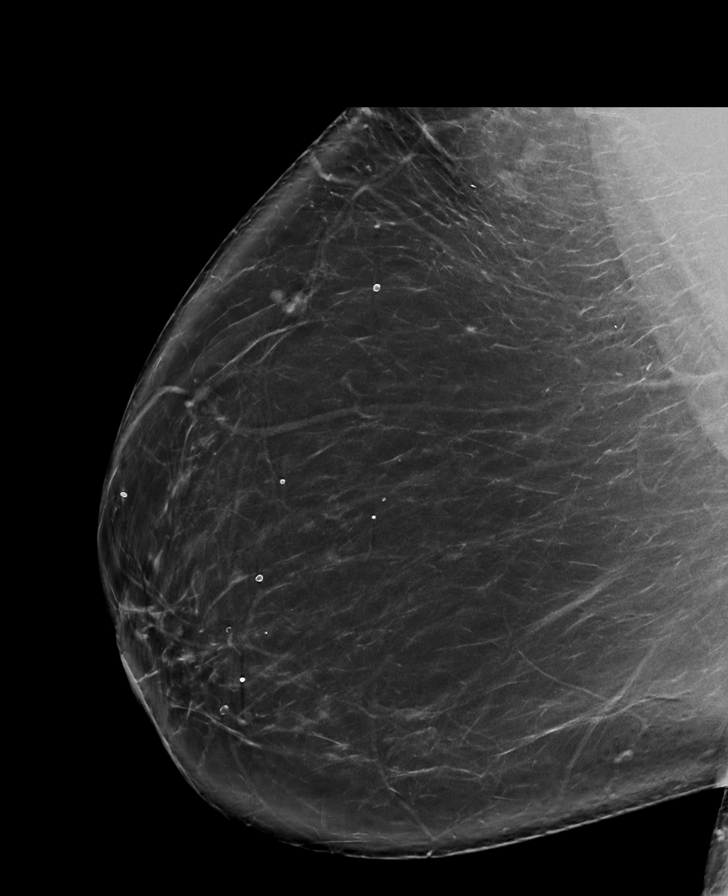

[R MLO tomo · tomo slice 53/106.0]
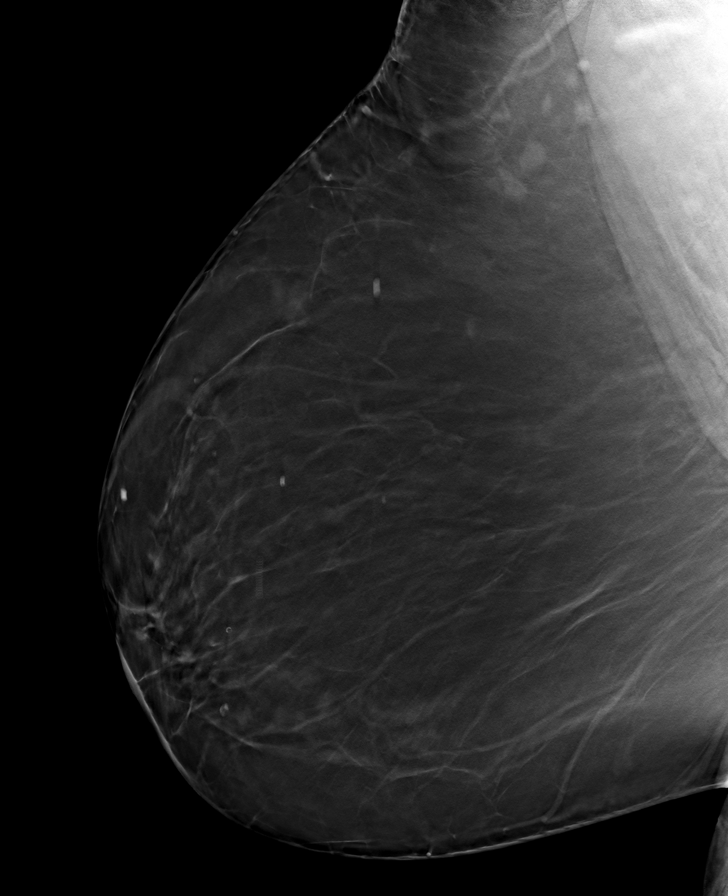

[L CC tomo · tomo slice 49/96.0]
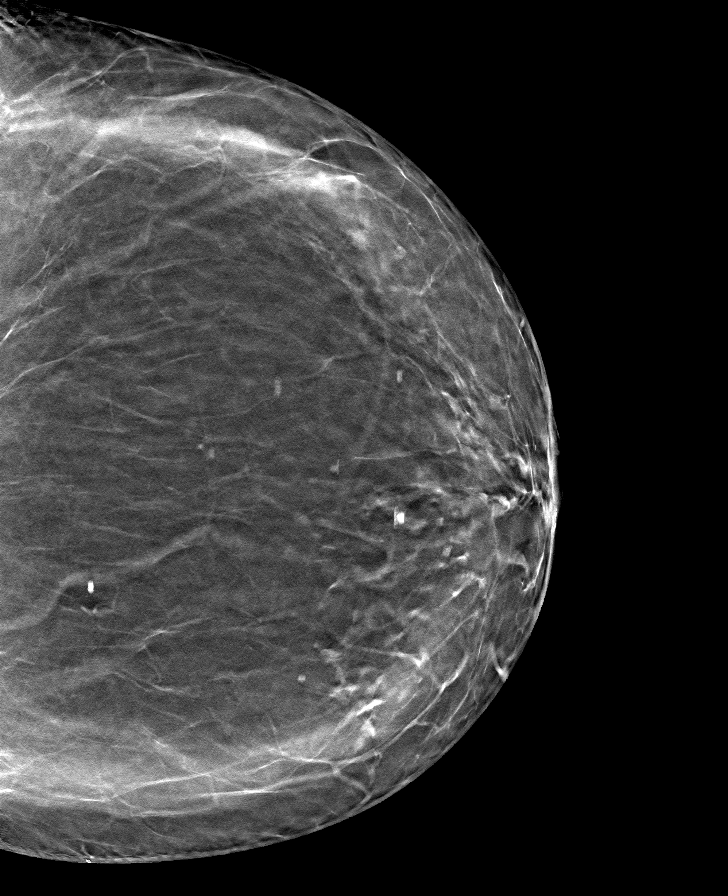

[R CC tomo · tomo slice 50/99.0]
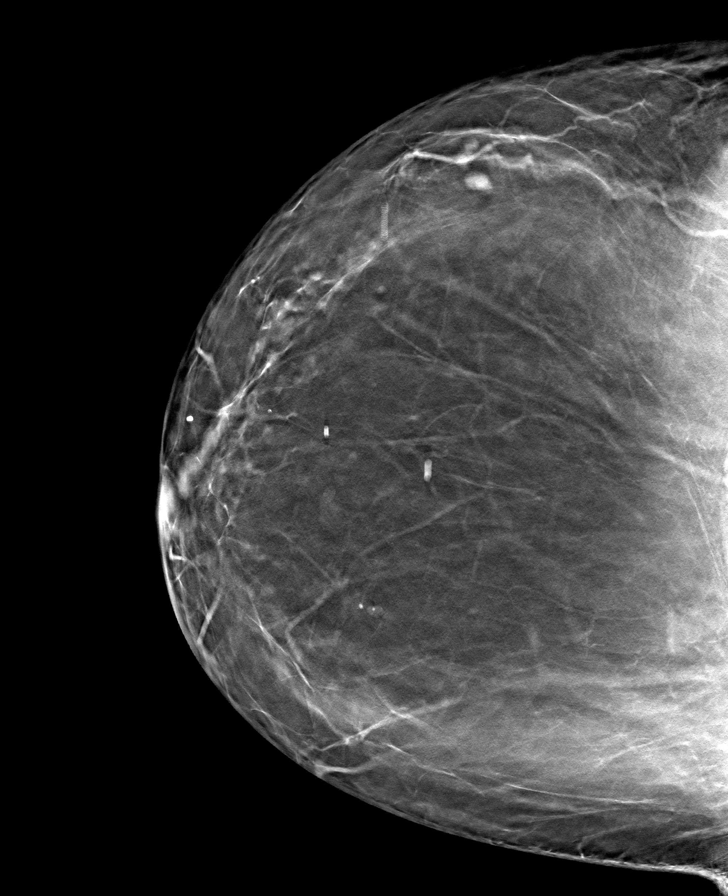

[L MLO tomo · tomo slice 55/108.0]
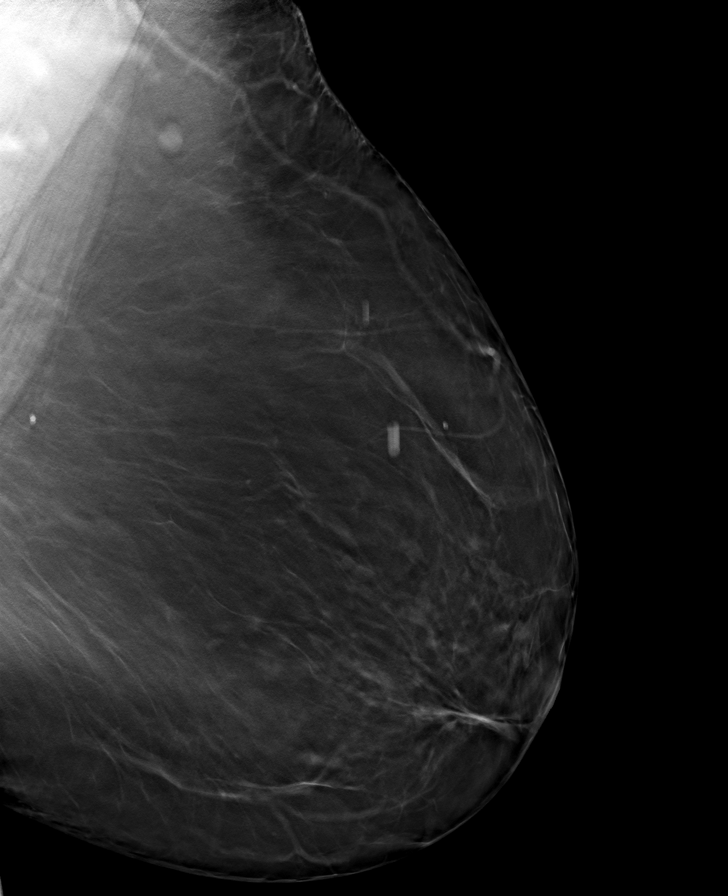

[8 of 24 positions shown; findings below may reference images not displayed]

FINDINGS: There are no findings suspicious for malignancy.
IMPRESSION: No mammographic evidence of malignancy. A result letter of this
screening mammogram will be mailed directly to the patient.

RECOMMENDATION:
Screening mammogram in one year. (Code:0E-3-N98)

BI-RADS CATEGORY  1: Negative.

## 2021-12-11 ENCOUNTER — Ambulatory Visit (INDEPENDENT_AMBULATORY_CARE_PROVIDER_SITE_OTHER): Payer: 59

## 2021-12-11 ENCOUNTER — Ambulatory Visit (INDEPENDENT_AMBULATORY_CARE_PROVIDER_SITE_OTHER): Payer: 59 | Admitting: Orthopaedic Surgery

## 2021-12-11 DIAGNOSIS — G8929 Other chronic pain: Secondary | ICD-10-CM

## 2021-12-11 DIAGNOSIS — M5441 Lumbago with sciatica, right side: Secondary | ICD-10-CM

## 2021-12-11 DIAGNOSIS — M5442 Lumbago with sciatica, left side: Secondary | ICD-10-CM | POA: Diagnosis not present

## 2021-12-11 MED ORDER — METHOCARBAMOL 500 MG PO TABS: 500.0000 mg | ORAL_TABLET | Freq: Two times a day (BID) | ORAL | 2 refills | Status: DC | PRN

## 2021-12-11 MED ORDER — PREDNISONE 5 MG (21) PO TBPK: ORAL_TABLET | ORAL | 0 refills | Status: DC

## 2021-12-11 NOTE — Progress Notes (Signed)
Office Visit Note   Patient: Mary Heath           Date of Birth: 30-Nov-1970           MRN: [id number] Visit Date: 12/11/2021              Requested by: Sherral Hammers, FNP [phone] [address] [place],  [place] [address] PCP: Sherral Hammers, FNP   Assessment & Plan: Visit Diagnoses:  1. Chronic bilateral low back pain with bilateral sciatica     Plan: Impression is chronic bilateral low back pain with bilateral lower extremity radiculopathy right greater than left.  At this point, would like to start the patient on a steroid taper muscle relaxer as well as add a course of formal physical therapy.  Referral has been made.  If her symptoms have not improved over the next few months she will let us know.  Call with concerns or questions in the meantime.  Follow-Up Instructions: Return if symptoms worsen or fail to improve.   Orders:  Orders Placed This Encounter  Procedures   XR HIPS BILAT W OR W/O PELVIS 3-4 VIEWS   XR Lumbar Spine 2-3 Views   Ambulatory referral to Physical Therapy   Meds ordered this encounter  Medications   predniSONE (STERAPRED UNI-PAK 21 TAB) 5 MG (21) TBPK tablet    Sig: Take as directed    Dispense:  21 tablet    Refill:  0   methocarbamol (ROBAXIN) 500 MG tablet    Sig: Take 1 tablet (500 mg total) by mouth 2 (two) times daily as needed for muscle spasms.    Dispense:  20 tablet    Refill:  2      Procedures: No procedures performed   Clinical Data: No additional findings.   Subjective: Chief Complaint  Patient presents with   Left Hip - Pain   Right Hip - Pain    HPI patient is a pleasant 51 year old female who comes in today with chronic bilateral low back pain right greater than left.  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  The pain appears to radiate down the buttock and then crosses to the anterior thigh down the leg into  the foot.  It is on both sides.  She describes constant pain worse when she is sitting, standing or lying down for longer than 15 to 20 minutes at a time.  She has been taking ibuprofen and Excedrin with mild relief.  She does note numbness to both lower legs and feet.  History of lumbar pathology.  Review of Systems as detailed in HPI.  All others reviewed and are negative.   Objective: Vital Signs: There were no vitals taken for this visit.  Physical Exam well-developed well-nourished female no acute distress.  Alert and oriented x3.  Ortho Exam lumbar spine exam shows no spinous tenderness.  She does have moderate bilateral paraspinous tenderness to the lower lumbar levels.  Increased pain with lumbar flexion and slight pain with extension.  Positive straight leg raise both sides.  Negative logroll and negative FADIR both sides.  No focal weakness.  She is neurovascular tact distally.  Specialty Comments:  No specialty comments available.  Imaging: XR Lumbar Spine 2-3 Views  Result Date: 12/11/2021 X-rays demonstrate moderate degenerative changes throughout the entire lumbar spine with evidence of scoliosis  XR HIPS BILAT  W OR W/O PELVIS 3-4 VIEWS  Result Date: 12/11/2021 Mild degenerative changes    PMFS History: Patient Active Problem List   Diagnosis Date Noted   Major depressive disorder, recurrent episode, severe (HCC) 04/21/2021   Generalized anxiety disorder 04/21/2021   Panic disorder 04/21/2021   BACK PAIN 01/14/2010   HSV 06/20/2009   HYPERCHOLESTEROLEMIA 06/19/2009   VARICOSE VEINS, LOWER EXTREMITIES 06/19/2009   LEG PAIN, LEFT 06/06/2009   TRICHOMONAL VAGINITIS 12/28/2007   ONYCHOMYCOSIS 12/05/2007   OBESITY 12/05/2007   UMBILICAL HERNIA 12/05/2007   Past Medical History:  Diagnosis Date   Depression    HSV-2 infection    Hyperlipidemia    Umbilical hernia    Varicose veins of both lower extremities     Family History  Problem Relation Age of Onset    Breast cancer Mother     Past Surgical History:  Procedure Laterality Date   TUBAL LIGATION     Social History   Occupational History   Not on file  Tobacco Use   Smoking status: Never   Smokeless tobacco: Never  Vaping Use   Vaping Use: Never used  Substance and Sexual Activity   Alcohol use: No   Drug use: No   Sexual activity: Never    Birth control/protection: Surgical

## 2021-12-26 ENCOUNTER — Ambulatory Visit (INDEPENDENT_AMBULATORY_CARE_PROVIDER_SITE_OTHER): Payer: 59 | Admitting: Rehabilitative and Restorative Service Providers"

## 2021-12-26 ENCOUNTER — Encounter: Payer: Self-pay | Admitting: Rehabilitative and Restorative Service Providers"

## 2021-12-26 DIAGNOSIS — R293 Abnormal posture: Secondary | ICD-10-CM | POA: Diagnosis not present

## 2021-12-26 DIAGNOSIS — M5416 Radiculopathy, lumbar region: Secondary | ICD-10-CM | POA: Diagnosis not present

## 2021-12-26 DIAGNOSIS — M5459 Other low back pain: Secondary | ICD-10-CM | POA: Diagnosis not present

## 2021-12-26 DIAGNOSIS — M6281 Muscle weakness (generalized): Secondary | ICD-10-CM

## 2021-12-26 NOTE — Therapy (Signed)
OUTPATIENT PHYSICAL THERAPY THORACOLUMBAR EVALUATION   Patient Name: Mary Heath MRN: [id number] DOB:[birth year], 51 y.o., female Today's Date: 12/26/2021   PT End of Session - 12/26/21 0933     Visit Number 1    Number of Visits 16    PT Start Time 0850    PT Stop Time 0930    PT Time Calculation (min) 40 min    Activity Tolerance Patient tolerated treatment well;No increased pain    Behavior During Therapy WFL for tasks assessed/performed             Past Medical History:  Diagnosis Date   Depression    HSV-2 infection    Hyperlipidemia    Umbilical hernia    Varicose veins of both lower extremities    Past Surgical History:  Procedure Laterality Date   TUBAL LIGATION     Patient Active Problem List   Diagnosis Date Noted   Major depressive disorder, recurrent episode, severe (HCC) 04/21/2021   Generalized anxiety disorder 04/21/2021   Panic disorder 04/21/2021   BACK PAIN 01/14/2010   HSV 06/20/2009   HYPERCHOLESTEROLEMIA 06/19/2009   VARICOSE VEINS, LOWER EXTREMITIES 06/19/2009   LEG PAIN, LEFT 06/06/2009   TRICHOMONAL VAGINITIS 12/28/2007   ONYCHOMYCOSIS 12/05/2007   OBESITY 12/05/2007   UMBILICAL HERNIA 12/05/2007    PCP: Sherral Hammers, FNP  REFERRING PROVIDER: Cristie Hem, PA-C  REFERRING DIAG: M54.42,M54.41,G89.29 (ICD-10-CM) - Chronic bilateral low back pain with bilateral sciatica   Rationale for Evaluation and Treatment Rehabilitation  THERAPY DIAG:  Abnormal posture  Other low back pain  Radiculopathy, lumbar region  Muscle weakness (generalized)  ONSET DATE: Chronic (about a year)  SUBJECTIVE:                                                                                                                                                                                           SUBJECTIVE STATEMENT: Mary Heath notes difficulty with prolonged sitting, standing and prolonged positions.  She can get sciatica to the ankles B.   She has a hard time finding a comfortable position at night.  She does well walking.  PERTINENT HISTORY:  HLD, vericose veins, obesity, anxiety  PAIN:  Are you having pain? Yes: NPRS scale: Low back and B 4-10/10 Pain location: Low back and both legs to the ankles Pain description: Stabbing Aggravating factors: Prolonged postures (sitting, lying down, standing) Relieving factors: Walking, ibuprofen (occasional)   PRECAUTIONS: Other: Avoid flexion with rotation  WEIGHT BEARING RESTRICTIONS No  FALLS:  Has patient fallen in last 6 months? No  LIVING ENVIRONMENT: Lives with: lives with their family and lives with  their daughter Lives in: House/apartment Stairs: No Has following equipment at home: Ramped entry  OCCUPATION: Cafeteria, is on her feet the whole time  PLOF:  Daughter has moved in to help with ADLs  PATIENT GOALS Less leg and back pain to be more independent   OBJECTIVE:   DIAGNOSTIC FINDINGS:  X-rays demonstrate moderate degenerative changes throughout the entire  lumbar spine with evidence of scoliosis   PATIENT SURVEYS:  FOTO 31 (Goal 50 in 12 visits)  SCREENING FOR RED FLAGS: Bowel or bladder incontinence: No Spinal tumors: No Cauda equina syndrome: No Compression fracture: No Abdominal aneurysm: No  COGNITION:  Overall cognitive status: Within functional limits for tasks assessed     SENSATION: Occasional L > R anterior thigh tingling  MUSCLE LENGTH: Hamstrings: Right 50 deg; Left 50 deg  POSTURE: rounded shoulders, forward head, and decreased lumbar lordosis  LUMBAR ROM:   Active  A/PROM  eval  Flexion   Extension 10  Right lateral flexion 25  Left lateral flexion 30  Right rotation   Left rotation    (Blank rows = not tested)  LOWER EXTREMITY ROM:     Active  Right eval Left eval  Hip flexion 75 70  Hip extension    Hip abduction    Hip adduction    Hip internal rotation 4 0  Hip external rotation 37 34  Knee flexion     Knee extension    Ankle dorsiflexion    Ankle plantarflexion    Ankle inversion    Ankle eversion     (Blank rows = not tested)  LOWER EXTREMITY MMT:    MMT Right eval Left eval  Hip flexion    Hip extension    Hip abduction    Hip adduction    Hip internal rotation    Hip external rotation    Knee flexion    Knee extension    Ankle dorsiflexion    Ankle plantarflexion    Ankle inversion    Ankle eversion     (Blank rows = not tested)  GAIT: Distance walked: In the clinic Assistive device utilized: None Level of assistance: Complete Independence Comments: Slow with B lateral lean    TODAY'S TREATMENT  12/26/2021 Functional Activities: Discussed spine anatomy, exam findings and reviewed imaging  Therapeutic Exercises: Lumbar extension (hands on gluteals) 10X 3 seconds Shoulder blade pinches 10X 5 seconds Modified Thomas stretch 2X 20 seconds B (4X next visit) Bridging 10X 5 seconds   PATIENT EDUCATION:  Education details: See above Person educated: Patient Education method: Explanation, Demonstration, Tactile cues, Verbal cues, and Handouts Education comprehension: verbalized understanding, returned demonstration, verbal cues required, tactile cues required, and needs further education   HOME EXERCISE PROGRAM: Access Code: [email or web address] URL: [email or web address] Date: 12/26/2021 Prepared by: Pauletta Browns  Exercises - Standing Lumbar Extension at Wall - Forearms  - 5 x daily - 7 x weekly - 1 sets - 5 reps - 3 seconds hold - Modified Thomas Stretch  - 2 x daily - 7 x weekly - 5 reps - 20 seconds hold - Bridge  - 2 x daily - 7 x weekly - 1 sets - 10 reps - 5 seconds hold - Standing Scapular Retraction  - 5 x daily - 7 x weekly - 1 sets - 5 reps - 5 second hold  ASSESSMENT:  CLINICAL IMPRESSION: Patient is a 51 y.o. female who was seen today for physical therapy evaluation and treatment for low back pain with B LE  radiculopathy.  Mary Heath has  limited lumbar extension AROM, tight hip flexors, poor low back strength and will benefit from practical postural and body mechanics work.   OBJECTIVE IMPAIRMENTS Abnormal gait, decreased activity tolerance, decreased endurance, decreased knowledge of condition, decreased mobility, difficulty walking, decreased ROM, decreased strength, decreased safety awareness, impaired perceived functional ability, impaired flexibility, improper body mechanics, postural dysfunction, obesity, and pain.   ACTIVITY LIMITATIONS carrying, lifting, bending, sitting, standing, squatting, sleeping, stairs, bed mobility, and locomotion level  PARTICIPATION LIMITATIONS: meal prep, cleaning, community activity, and occupation  PERSONAL FACTORS HLD, vericose veins, obesity, anxiety are also affecting patient's functional outcome.   REHAB POTENTIAL: Good  CLINICAL DECISION MAKING: Stable/uncomplicated  EVALUATION COMPLEXITY: Low   GOALS: Goals reviewed with patient? Yes  SHORT TERM GOALS: Target date: 01/23/2022  Harlee will improve hip flexion to at least 90 degrees Baseline: 70-75 degrees Goal status: INITIAL  2.  Improve lumbar extension to > 10 degrees Baseline: 10 degrees Goal status: INITIAL  3.  Gabryelle will be independent in her day 1 HEP  Baseline: Started 12/26/2021 Goal status: INITIAL  LONG TERM GOALS: Target date: 02/20/2022  Improve FOTO to 50 Baseline: 31 Goal status: INITIAL  2.  Improve low back pain to 0-4/10 with sciatica at 0-2/10 on the NPRS Baseline: 4-10/10 Goal status: INITIAL  3.  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  Payeton will be independent with her long-term HEP at [place] Baseline: Started 12/26/2021 Goal status: INITIAL PLAN: PT FREQUENCY: 1-2x/week  PT DURATION: 8 weeks  PLANNED INTERVENTIONS: Therapeutic exercises,  Therapeutic activity, Neuromuscular re-education, Gait training, Patient/Family education, Self Care, Dry Needling, Cryotherapy, Traction, and Manual therapy.  PLAN FOR NEXT SESSION: Review day 1 HEP.  Practical posture and body mechanics work for her job at [employer].  Progress low back strength in an effort to decrease pain and reduce sciatica.   Cherlyn Cushing, PT, MPT 12/26/2021, 9:35 AM

## 2022-01-07 NOTE — Therapy (Signed)
OUTPATIENT PHYSICAL THERAPY TREATMENT NOTE   Patient Name: Mary Heath MRN: [id number] DOB:10-Feb-1971, 51 y.o., female Today's Date: 01/08/2022  PCP: Sherral Hammers, FNP   REFERRING PROVIDER: Cristie Hem, PA-C   END OF SESSION:   PT End of Session - 01/08/22 0904     Visit Number 2    Number of Visits 16    Date for PT Re-Evaluation 02/20/22    Authorization Type [employer]    PT Start Time [phone]    PT Stop Time 0915    PT Time Calculation (min) 41 min    Activity Tolerance Patient tolerated treatment well    Behavior During Therapy [facility] for tasks assessed/performed             Past Medical History:  Diagnosis Date   Depression    HSV-2 infection    Hyperlipidemia    Umbilical hernia    Varicose veins of both lower extremities    Past Surgical History:  Procedure Laterality Date   TUBAL LIGATION     Patient Active Problem List   Diagnosis Date Noted   Major depressive disorder, recurrent episode, severe (HCC) 04/21/2021   Generalized anxiety disorder 04/21/2021   Panic disorder 04/21/2021   BACK PAIN 01/14/2010   HSV 06/20/2009   HYPERCHOLESTEROLEMIA 06/19/2009   VARICOSE VEINS, LOWER EXTREMITIES 06/19/2009   LEG PAIN, LEFT 06/06/2009   TRICHOMONAL VAGINITIS 12/28/2007   ONYCHOMYCOSIS 12/05/2007   OBESITY 12/05/2007   UMBILICAL HERNIA 12/05/2007    REFERRING DIAG: Chronic bilateral low back pain with bilateral sciatica  THERAPY DIAG:  Other low back pain  Muscle weakness (generalized)  Abnormal posture  Radiculopathy, lumbar region  Rationale for Evaluation and Treatment Rehabilitation  PERTINENT HISTORY: HLD, vericose veins, obesity, anxiety  PRECAUTIONS: None   SUBJECTIVE: Patient reports stiff and she hurts mainly when she sits or lays down for a while. She has been doing exercises consistently.  PAIN:  Are you having pain? Yes:  NPRS scale: 8/10 Pain location: Low back and both legs to the ankles Pain description:  Stabbing Aggravating factors: Prolonged postures (sitting, lying down, standing) Relieving factors: Walking, ibuprofen (occasional)  PATIENT GOALS Less leg and back pain to be more independent   OBJECTIVE: (objective measures completed at initial evaluation unless otherwise dated) PATIENT SURVEYS:  FOTO 31 (Goal 50 in 12 visits)   SENSATION: Occasional L > R anterior thigh tingling   MUSCLE LENGTH: Hamstrings: Right 50 deg; Left 50 deg   POSTURE:  Rounded shoulders, forward head, and decreased lumbar lordosis   LUMBAR ROM:    Active  A/PROM  eval  Flexion    Extension 10  Right lateral flexion 25  Left lateral flexion 30  Right rotation    Left rotation     (Blank rows = not tested)   LOWER EXTREMITY ROM:      Active  Right eval Left eval  Hip flexion 75 70  Hip extension      Hip abduction      Hip adduction      Hip internal rotation 4 0  Hip external rotation 37 34  Knee flexion      Knee extension      Ankle dorsiflexion      Ankle plantarflexion      Ankle inversion      Ankle eversion       (Blank rows = not tested)   LOWER EXTREMITY MMT:     MMT Right 01/08/2022 Left 01/08/2022  Hip flexion      Hip extension 3 3   Hip abduction  3  3   Knee flexion      Knee extension      Ankle dorsiflexion      Ankle plantarflexion      Ankle inversion      Ankle eversion       (Blank rows = not tested)   GAIT: Distance walked: In the clinic Assistive device utilized: None Level of assistance: Complete Independence Comments: Slow with B lateral lean     TODAY'S TREATMENT  [facility] Adult PT Treatment:                                                DATE: 01/08/2022 Therapeutic Exercise: NuStep L5 x 5 min with UE/LE while taking subjective LTR x 10 each Hooklying SKTC stretch using towel 2 x 20 sec each Hooklying PPT 10 x 5 sec Bridge 2 x 10 SLR with focus on abdominal activation 2 x 10 each Hooklying clamshell with blue 2 x 10 Seated physioball  rollout 10 x 5 sec Seated hamstring stretch 2 x 20 sec each    PATIENT EDUCATION:  Education details: HEP Person educated: Patient Education method: Programmer, multimedia, Demonstration, Actor cues, Verbal cues Education comprehension: verbalized understanding, returned demonstration, verbal cues required, tactile cues required, and needs further education   HOME EXERCISE PROGRAM: Access Code: [email or web address]    ASSESSMENT: CLINICAL IMPRESSION: Patient tolerated therapy well with no adverse effects. Therapy focused on stretching and lumbar mobility exercises and progression of core and hip strengthening with good tolerance. She does exhibit gross strength deficits of the hips and required frequent cueing for proper technique and core activation with exercises. No changes made to HEP this visit. Patient would benefit from continue skilled PT to progress her mobility and strength in order to reduce pain and maximize functional ability.     OBJECTIVE IMPAIRMENTS Abnormal gait, decreased activity tolerance, decreased endurance, decreased knowledge of condition, decreased mobility, difficulty walking, decreased ROM, decreased strength, decreased safety awareness, impaired perceived functional ability, impaired flexibility, improper body mechanics, postural dysfunction, obesity, and pain.    ACTIVITY LIMITATIONS carrying, lifting, bending, sitting, standing, squatting, sleeping, stairs, bed mobility, and locomotion level   PARTICIPATION LIMITATIONS: meal prep, cleaning, community activity, and occupation   PERSONAL FACTORS HLD, vericose veins, obesity, anxiety are also affecting patient's functional outcome.      GOALS: Goals reviewed with patient? Yes   SHORT TERM GOALS: Target date: 01/23/2022   Mary Heath will improve hip flexion to at least 90 degrees Baseline: 70-75 degrees Goal status: INITIAL   2.  Improve lumbar extension to > 10 degrees Baseline: 10 degrees Goal status: INITIAL   3.  Mary Heath  will be independent in her day 1 HEP  Baseline: Started 12/26/2021 Goal status: INITIAL   LONG TERM GOALS: Target date: 02/20/2022   Improve FOTO to 50 Baseline: 31 Goal status: INITIAL   2.  Improve low back pain to 0-4/10 with sciatica at 0-2/10 on the NPRS Baseline: 4-10/10 Goal status: INITIAL   3.  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  Mary Heath will be independent with her long-term HEP at [place] Baseline: Started 12/26/2021 Goal status:  INITIAL   PLAN: PT FREQUENCY: 1-2x/week   PT DURATION: 8 weeks   PLANNED INTERVENTIONS: Therapeutic exercises, Therapeutic activity, Neuromuscular re-education, Gait training, Patient/Family education, Self Care, Dry Needling, Cryotherapy, Traction, and Manual therapy.   PLAN FOR NEXT SESSION: Review HEP and progress PRN,  Practical posture and body mechanics work for her job at [employer].  Progress low back strength in an effort to decrease pain and reduce sciatica.    Rosana Hoes, PT, DPT, LAT, ATC 01/08/22  9:28 AM Phone: [phone] Fax: [phone]

## 2022-01-08 ENCOUNTER — Ambulatory Visit: Payer: 59 | Attending: Physician Assistant | Admitting: Physical Therapy

## 2022-01-08 ENCOUNTER — Encounter: Payer: Self-pay | Admitting: Physical Therapy

## 2022-01-08 ENCOUNTER — Other Ambulatory Visit: Payer: Self-pay

## 2022-01-08 DIAGNOSIS — M5459 Other low back pain: Secondary | ICD-10-CM | POA: Insufficient documentation

## 2022-01-08 DIAGNOSIS — M6281 Muscle weakness (generalized): Secondary | ICD-10-CM | POA: Insufficient documentation

## 2022-01-08 DIAGNOSIS — R293 Abnormal posture: Secondary | ICD-10-CM | POA: Insufficient documentation

## 2022-01-08 DIAGNOSIS — M5416 Radiculopathy, lumbar region: Secondary | ICD-10-CM | POA: Insufficient documentation

## 2022-01-14 ENCOUNTER — Ambulatory Visit: Payer: 59 | Admitting: Physical Therapy

## 2022-01-14 ENCOUNTER — Encounter: Payer: Self-pay | Admitting: Physical Therapy

## 2022-01-14 ENCOUNTER — Other Ambulatory Visit: Payer: Self-pay

## 2022-01-14 DIAGNOSIS — R293 Abnormal posture: Secondary | ICD-10-CM

## 2022-01-14 DIAGNOSIS — M6281 Muscle weakness (generalized): Secondary | ICD-10-CM

## 2022-01-14 DIAGNOSIS — M5459 Other low back pain: Secondary | ICD-10-CM

## 2022-01-14 DIAGNOSIS — M5416 Radiculopathy, lumbar region: Secondary | ICD-10-CM

## 2022-01-14 NOTE — Patient Instructions (Signed)
Access Code: [email or web address] URL: [email or web address] Date: 01/14/2022 Prepared by: Rosana Hoes  Exercises - Standing Lumbar Extension at Wall - Forearms  - 5 x daily - 7 x weekly - 1 sets - 5 reps - 3 seconds hold - Modified Thomas Stretch  - 2 x daily - 7 x weekly - 5 reps - 20 seconds hold - Standing Scapular Retraction  - 5 x daily - 7 x weekly - 1 sets - 5 reps - 5 second hold - Supine Lower Trunk Rotation  - 1 x daily - 7 x weekly - 10 reps - 5 seconds hold - Bridge  - 1 x daily - 7 x weekly - 2 sets - 10 reps - 5 seconds hold - Hooklying Clamshell with Resistance  - 1 x daily - 7 x weekly - 2 sets - 10 reps - 5 seconds hold - Supine Active Straight Leg Raise  - 1 x daily - 7 x weekly - 2 sets - 10 reps

## 2022-01-14 NOTE — Therapy (Signed)
OUTPATIENT PHYSICAL THERAPY TREATMENT NOTE   Patient Name: Mary Heath MRN: [id number] DOB:Apr 22, 1971, 51 y.o., female Today's Date: 01/14/2022  PCP: Sherral Hammers, FNP   REFERRING PROVIDER: Cristie Hem, PA-C   END OF SESSION:   PT End of Session - 01/14/22 0921     Visit Number 3    Number of Visits 16    Date for PT Re-Evaluation 02/20/22    Authorization Type [employer]    PT Start Time 0915    PT Stop Time 0955    PT Time Calculation (min) 40 min    Activity Tolerance Patient tolerated treatment well    Behavior During Therapy [facility] for tasks assessed/performed              Past Medical History:  Diagnosis Date   Depression    HSV-2 infection    Hyperlipidemia    Umbilical hernia    Varicose veins of both lower extremities    Past Surgical History:  Procedure Laterality Date   TUBAL LIGATION     Patient Active Problem List   Diagnosis Date Noted   Major depressive disorder, recurrent episode, severe (HCC) 04/21/2021   Generalized anxiety disorder 04/21/2021   Panic disorder 04/21/2021   BACK PAIN 01/14/2010   HSV 06/20/2009   HYPERCHOLESTEROLEMIA 06/19/2009   VARICOSE VEINS, LOWER EXTREMITIES 06/19/2009   LEG PAIN, LEFT 06/06/2009   TRICHOMONAL VAGINITIS 12/28/2007   ONYCHOMYCOSIS 12/05/2007   OBESITY 12/05/2007   UMBILICAL HERNIA 12/05/2007    REFERRING DIAG: Chronic bilateral low back pain with bilateral sciatica  THERAPY DIAG:  Other low back pain  Muscle weakness (generalized)  Abnormal posture  Radiculopathy, lumbar region  Rationale for Evaluation and Treatment Rehabilitation  PERTINENT HISTORY: HLD, vericose veins, obesity, anxiety  PRECAUTIONS: None   SUBJECTIVE: Patient reports she is feeling pretty good today. She still has her good days and bad days, today is a good day.  PAIN:  Are you having pain? Yes:  NPRS scale: 4.5/10 Pain location: Low back and both legs to the ankles Pain description:  Stabbing Aggravating factors: Prolonged postures (sitting, lying down, standing) Relieving factors: Walking, ibuprofen (occasional)  PATIENT GOALS Less leg and back pain to be more independent   OBJECTIVE: (objective measures completed at initial evaluation unless otherwise dated) PATIENT SURVEYS:  FOTO 31 (Goal 50 in 12 visits)   SENSATION: Occasional L > R anterior thigh tingling   MUSCLE LENGTH: Hamstrings: Right 50 deg; Left 50 deg   POSTURE:  Rounded shoulders, forward head, and decreased lumbar lordosis   LUMBAR ROM:    Active  A/PROM  eval  01/14/2022  Flexion   75%  Extension 10 50%  Right lateral flexion 25   Left lateral flexion 30   Right rotation   75%  Left rotation   75%   (Blank rows = not tested)   LOWER EXTREMITY ROM:      Active  Right eval Left eval  Hip flexion 75 70  Hip extension      Hip abduction      Hip adduction      Hip internal rotation 4 0  Hip external rotation 37 34  Knee flexion      Knee extension      Ankle dorsiflexion      Ankle plantarflexion      Ankle inversion      Ankle eversion       (Blank rows = not tested)   LOWER EXTREMITY MMT:  MMT Right 01/08/2022 Left 01/08/2022  Hip flexion      Hip extension 3 3   Hip abduction  3  3   Knee flexion      Knee extension      Ankle dorsiflexion      Ankle plantarflexion      Ankle inversion      Ankle eversion       (Blank rows = not tested)   GAIT: Distance walked: In the clinic Assistive device utilized: None Level of assistance: Complete Independence Comments: Slow with B lateral lean     TODAY'S TREATMENT  [facility] Adult PT Treatment:                                                DATE: 01/14/2022 Therapeutic Exercise: NuStep L5 x 5 min with UE/LE while taking subjective Seated hamstring stretch 2 x 20 sec each Seated physioball rollout 10 x 5 sec LTR 10 x 5 sec each Hooklying SKTC stretch using towel 5 x 10 sec each Bridge 2 x 10 SLR with focus on  abdominal activation 2 x 10 each Hooklying clamshell with blue 2 x 10 Sit to stand without UE support 2 x 10   [facility] Adult PT Treatment:                                                DATE: 01/08/2022 Therapeutic Exercise: NuStep L5 x 5 min with UE/LE while taking subjective LTR x 10 each Hooklying SKTC stretch using towel 2 x 20 sec each Hooklying PPT 10 x 5 sec Bridge 2 x 10 SLR with focus on abdominal activation 2 x 10 each Hooklying clamshell with blue 2 x 10 Seated physioball rollout 10 x 5 sec Seated hamstring stretch 2 x 20 sec each   PATIENT EDUCATION:  Education details: HEP update Person educated: Patient Education method: Explanation, Demonstration, Tactile cues, Verbal cues, Handout Education comprehension: verbalized understanding, returned demonstration, verbal cues required, tactile cues required, and needs further education   HOME EXERCISE PROGRAM: Access Code: [email or web address]    ASSESSMENT: CLINICAL IMPRESSION: Patient tolerated therapy well with no adverse effects. Therapy continues to focus on lumbar mobility and progression of core and hip strengthening with good tolerance. She does demonstrate improve lumbar motion this visit but overall still with limitation. She did not report any increased pain with exercises this visit. Updated HEP this visit to progress her mobility and strengthening exercises at home. Patient would benefit from continue skilled PT to progress her mobility and strength in order to reduce pain and maximize functional ability.     OBJECTIVE IMPAIRMENTS Abnormal gait, decreased activity tolerance, decreased endurance, decreased knowledge of condition, decreased mobility, difficulty walking, decreased ROM, decreased strength, decreased safety awareness, impaired perceived functional ability, impaired flexibility, improper body mechanics, postural dysfunction, obesity, and pain.    ACTIVITY LIMITATIONS carrying, lifting, bending, sitting, standing,  squatting, sleeping, stairs, bed mobility, and locomotion level   PARTICIPATION LIMITATIONS: meal prep, cleaning, community activity, and occupation   PERSONAL FACTORS HLD, vericose veins, obesity, anxiety are also affecting patient's functional outcome.      GOALS: Goals reviewed with patient? Yes   SHORT TERM GOALS: Target date: 01/23/2022   Sofhia will  improve hip flexion to at least 90 degrees Baseline: 70-75 degrees Goal status: INITIAL   2.  Improve lumbar extension to > 10 degrees Baseline: 10 degrees Goal status: INITIAL   3.  Sirenity will be independent in her day 1 HEP  Baseline: Started 12/26/2021 Goal status: INITIAL   LONG TERM GOALS: Target date: 02/20/2022   Improve FOTO to 50 Baseline: 31 Goal status: INITIAL   2.  Improve low back pain to 0-4/10 with sciatica at 0-2/10 on the NPRS Baseline: 4-10/10 Goal status: INITIAL   3.  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  Takeia will be independent with her long-term HEP at [place] Baseline: Started 12/26/2021 Goal status: INITIAL   PLAN: PT FREQUENCY: 1-2x/week   PT DURATION: 8 weeks   PLANNED INTERVENTIONS: Therapeutic exercises, Therapeutic activity, Neuromuscular re-education, Gait training, Patient/Family education, Self Care, Dry Needling, Cryotherapy, Traction, and Manual therapy.   PLAN FOR NEXT SESSION: Review HEP and progress PRN,  Practical posture and body mechanics work for her job at [employer].  Progress low back strength in an effort to decrease pain and reduce sciatica.    Rosana Hoes, PT, DPT, LAT, ATC 01/14/22  9:59 AM Phone: [phone] Fax: [phone]

## 2022-01-20 ENCOUNTER — Ambulatory Visit: Payer: 59 | Admitting: Physical Therapy

## 2022-01-27 ENCOUNTER — Encounter: Payer: Self-pay | Admitting: Physical Therapy

## 2022-01-27 ENCOUNTER — Other Ambulatory Visit: Payer: Self-pay

## 2022-01-27 ENCOUNTER — Ambulatory Visit: Payer: Self-pay | Attending: Physician Assistant | Admitting: Physical Therapy

## 2022-01-27 DIAGNOSIS — R293 Abnormal posture: Secondary | ICD-10-CM | POA: Insufficient documentation

## 2022-01-27 DIAGNOSIS — M6281 Muscle weakness (generalized): Secondary | ICD-10-CM | POA: Insufficient documentation

## 2022-01-27 DIAGNOSIS — M5416 Radiculopathy, lumbar region: Secondary | ICD-10-CM | POA: Insufficient documentation

## 2022-01-27 DIAGNOSIS — M5459 Other low back pain: Secondary | ICD-10-CM | POA: Insufficient documentation

## 2022-01-27 NOTE — Therapy (Signed)
OUTPATIENT PHYSICAL THERAPY TREATMENT NOTE   Patient Name: Mary Heath MRN: [id number] DOB:July 25, 1970, 51 y.o., female Today's Date: 01/27/2022  PCP: Sherral Hammers, FNP   REFERRING PROVIDER: Cristie Hem, PA-C   END OF SESSION:   PT End of Session - 01/27/22 0841     Visit Number 4    Number of Visits 16    Date for PT Re-Evaluation 02/20/22    Authorization Type --    PT Start Time 0837    PT Stop Time 0915    PT Time Calculation (min) 38 min    Activity Tolerance Patient tolerated treatment well    Behavior During Therapy [facility] for tasks assessed/performed               Past Medical History:  Diagnosis Date   Depression    HSV-2 infection    Hyperlipidemia    Umbilical hernia    Varicose veins of both lower extremities    Past Surgical History:  Procedure Laterality Date   TUBAL LIGATION     Patient Active Problem List   Diagnosis Date Noted   Major depressive disorder, recurrent episode, severe (HCC) 04/21/2021   Generalized anxiety disorder 04/21/2021   Panic disorder 04/21/2021   BACK PAIN 01/14/2010   HSV 06/20/2009   HYPERCHOLESTEROLEMIA 06/19/2009   VARICOSE VEINS, LOWER EXTREMITIES 06/19/2009   LEG PAIN, LEFT 06/06/2009   TRICHOMONAL VAGINITIS 12/28/2007   ONYCHOMYCOSIS 12/05/2007   OBESITY 12/05/2007   UMBILICAL HERNIA 12/05/2007    REFERRING DIAG: Chronic bilateral low back pain with bilateral sciatica  THERAPY DIAG:  Other low back pain  Muscle weakness (generalized)  Abnormal posture  Radiculopathy, lumbar region  Rationale for Evaluation and Treatment Rehabilitation  PERTINENT HISTORY: HLD, vericose veins, obesity, anxiety  PRECAUTIONS: None   SUBJECTIVE: Patient reports she is doing well. He right knee is bothering her today.   PAIN:  Are you having pain? Yes:  NPRS scale: 4/10 Pain location: Low back and both legs to the ankles Pain description: Stabbing Aggravating factors: Prolonged postures (sitting,  lying down, standing) Relieving factors: Walking, ibuprofen (occasional)  PATIENT GOALS Less leg and back pain to be more independent   OBJECTIVE: (objective measures completed at initial evaluation unless otherwise dated) PATIENT SURVEYS:  FOTO 31 (Goal 50 in 12 visits)   SENSATION: Occasional L > R anterior thigh tingling   MUSCLE LENGTH: Hamstrings: Right 50 deg; Left 50 deg   POSTURE:  Rounded shoulders, forward head, and decreased lumbar lordosis   LUMBAR ROM:    Active  A/PROM  eval  01/14/2022  Flexion   75%  Extension 10 50%  Right lateral flexion 25   Left lateral flexion 30   Right rotation   75%  Left rotation   75%   (Blank rows = not tested)   LOWER EXTREMITY ROM:      Active  Right eval Left eval  Hip flexion 75 70  Hip extension      Hip abduction      Hip adduction      Hip internal rotation 4 0  Hip external rotation 37 34  Knee flexion      Knee extension      Ankle dorsiflexion      Ankle plantarflexion      Ankle inversion      Ankle eversion       (Blank rows = not tested)   LOWER EXTREMITY MMT:     MMT Right 01/08/2022 Left  01/08/2022  Hip flexion      Hip extension 3 3   Hip abduction  3  3   Knee flexion      Knee extension      Ankle dorsiflexion      Ankle plantarflexion      Ankle inversion      Ankle eversion       (Blank rows = not tested)   GAIT: Distance walked: In the clinic Assistive device utilized: None Level of assistance: Complete Independence Comments: Slow with B lateral lean     TODAY'S TREATMENT  [facility] Adult PT Treatment:                                                DATE: 01/27/2022 Therapeutic Exercise: NuStep L5 x 5 min with UE/LE while taking subjective Seated physioball rollout 10 x 5 sec LTR 10 x 5 sec each Bridge 2 x 10 - bolster under legs due to right knee pain SLR with focus on abdominal activation 2 x 10 each Sidelying hip abduction 2 x 10 each Sit to stand without UE support 2 x  10   [facility] Adult PT Treatment:                                                DATE: 01/14/2022 Therapeutic Exercise: NuStep L5 x 5 min with UE/LE while taking subjective Seated hamstring stretch 2 x 20 sec each Seated physioball rollout 10 x 5 sec LTR 10 x 5 sec each Hooklying SKTC stretch using towel 5 x 10 sec each Bridge 2 x 10 SLR with focus on abdominal activation 2 x 10 each Hooklying clamshell with blue 2 x 10 Sit to stand without UE support 2 x 10  [facility] Adult PT Treatment:                                                DATE: 01/08/2022 Therapeutic Exercise: NuStep L5 x 5 min with UE/LE while taking subjective LTR x 10 each Hooklying SKTC stretch using towel 2 x 20 sec each Hooklying PPT 10 x 5 sec Bridge 2 x 10 SLR with focus on abdominal activation 2 x 10 each Hooklying clamshell with blue 2 x 10 Seated physioball rollout 10 x 5 sec Seated hamstring stretch 2 x 20 sec each   PATIENT EDUCATION:  Education details: HEP Person educated: Patient Education method: Programmer, multimedia, Demonstration, Actor cues, Verbal cues, Handout Education comprehension: verbalized understanding, returned demonstration, verbal cues required, tactile cues required, and needs further education   HOME EXERCISE PROGRAM: Access Code: [email or web address]    ASSESSMENT: CLINICAL IMPRESSION: Patient tolerated therapy well with no adverse effects. Therapy continues ot focus on progressing her lumbar motion and core and hip strengthening. She did report increased right knee pain this visit but able to complete all prescribed exercises. She did note improvement in her symptoms following therapy. No changes made to HEP this visit. Patient would benefit from continue skilled PT to progress her mobility and strength in order to reduce pain and maximize functional ability.     OBJECTIVE  IMPAIRMENTS Abnormal gait, decreased activity tolerance, decreased endurance, decreased knowledge of condition, decreased mobility,  difficulty walking, decreased ROM, decreased strength, decreased safety awareness, impaired perceived functional ability, impaired flexibility, improper body mechanics, postural dysfunction, obesity, and pain.    ACTIVITY LIMITATIONS carrying, lifting, bending, sitting, standing, squatting, sleeping, stairs, bed mobility, and locomotion level   PARTICIPATION LIMITATIONS: meal prep, cleaning, community activity, and occupation   PERSONAL FACTORS HLD, vericose veins, obesity, anxiety are also affecting patient's functional outcome.      GOALS: Goals reviewed with patient? Yes   SHORT TERM GOALS: Target date: 01/23/2022   Ashia will improve hip flexion to at least 90 degrees Baseline: 70-75 degrees Goal status: INITIAL   2.  Improve lumbar extension to > 10 degrees Baseline: 10 degrees Goal status: INITIAL   3.  Ashantia will be independent in her day 1 HEP  Baseline: Started 12/26/2021 Goal status: INITIAL   LONG TERM GOALS: Target date: 02/20/2022   Improve FOTO to 50 Baseline: 31 Goal status: INITIAL   2.  Improve low back pain to 0-4/10 with sciatica at 0-2/10 on the NPRS Baseline: 4-10/10 Goal status: INITIAL   3.  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  Gabriellia will be independent with her long-term HEP at [place] Baseline: Started 12/26/2021 Goal status: INITIAL   PLAN: PT FREQUENCY: 1-2x/week   PT DURATION: 8 weeks   PLANNED INTERVENTIONS: Therapeutic exercises, Therapeutic activity, Neuromuscular re-education, Gait training, Patient/Family education, Self Care, Dry Needling, Cryotherapy, Traction, and Manual therapy.   PLAN FOR NEXT SESSION: Review HEP and progress PRN,  Practical posture and body mechanics work for her job at [employer].  Progress low back strength in an effort to decrease pain and reduce  sciatica.    Rosana Hoes, PT, DPT, LAT, ATC 01/27/22  9:22 AM Phone: [phone] Fax: [phone]

## 2022-02-02 NOTE — Therapy (Addendum)
OUTPATIENT PHYSICAL THERAPY TREATMENT NOTE  DISCHARGE   Patient Name: Mary Heath MRN: [id number] DOB:1971-01-10, 51 y.o., female Today's Date: 02/03/2022  PCP: Delford Field, FNP   REFERRING PROVIDER: Aundra Dubin, PA-C   END OF SESSION:   PT End of Session - 02/03/22 0827     Visit Number 5    Number of Visits 16    Date for PT Re-Evaluation 02/20/22    PT Start Time 0823    PT Stop Time 0905    PT Time Calculation (min) 42 min    Activity Tolerance Patient tolerated treatment well    Behavior During Therapy [facility] for tasks assessed/performed                Past Medical History:  Diagnosis Date   Depression    HSV-2 infection    Hyperlipidemia    Umbilical hernia    Varicose veins of both lower extremities    Past Surgical History:  Procedure Laterality Date   TUBAL LIGATION     Patient Active Problem List   Diagnosis Date Noted   Major depressive disorder, recurrent episode, severe ([place]) 04/21/2021   Generalized anxiety disorder 04/21/2021   Panic disorder 04/21/2021   BACK PAIN 01/14/2010   HSV 06/20/2009   HYPERCHOLESTEROLEMIA 06/19/2009   VARICOSE VEINS, LOWER EXTREMITIES 06/19/2009   LEG PAIN, LEFT 06/06/2009   TRICHOMONAL VAGINITIS 12/28/2007   ONYCHOMYCOSIS 12/05/2007   OBESITY 28/41/3244   UMBILICAL HERNIA 05/27/7251    REFERRING DIAG: Chronic bilateral low back pain with bilateral sciatica  THERAPY DIAG:  Other low back pain  Muscle weakness (generalized)  Abnormal posture  Radiculopathy, lumbar region  Rationale for Evaluation and Treatment Rehabilitation  PERTINENT HISTORY: HLD, vericose veins, obesity, anxiety  PRECAUTIONS: None   SUBJECTIVE: Patient reports she is doing alright. No new issues. States she still has a little pain the right knee and leg, back is feeling better.   PAIN:  Are you having pain? Yes:  NPRS scale: 5/10 Pain location: Low back and both legs to the ankles Pain description:  Stabbing Aggravating factors: Prolonged postures (sitting, lying down, standing) Relieving factors: Walking, ibuprofen (occasional)  PATIENT GOALS Less leg and back pain to be more independent   OBJECTIVE: (objective measures completed at initial evaluation unless otherwise dated) PATIENT SURVEYS:  FOTO 31 (Goal 50 in 12 visits)   SENSATION: Occasional L > R anterior thigh tingling   MUSCLE LENGTH: Hamstrings: Right 50 deg; Left 50 deg   POSTURE:  Rounded shoulders, forward head, and decreased lumbar lordosis   LUMBAR ROM:    Active  A/PROM  eval  01/14/2022  Flexion   75%  Extension 10 50%  Right lateral flexion 25   Left lateral flexion 30   Right rotation   75%  Left rotation   75%   (Blank rows = not tested)   LOWER EXTREMITY ROM:      Active  Right eval Left eval  Hip flexion 75 70  Hip extension      Hip abduction      Hip adduction      Hip internal rotation 4 0  Hip external rotation 37 34  Knee flexion      Knee extension      Ankle dorsiflexion      Ankle plantarflexion      Ankle inversion      Ankle eversion       (Blank rows = not tested)   LOWER EXTREMITY  MMT:     MMT Right 01/08/2022 Left 01/08/2022 Rt / Lt 02/03/2022  Hip flexion     4- / 4-  Hip extension 3 3  4- / 4-  Hip abduction  3  3  4- / 4-  Knee flexion       Knee extension       Ankle dorsiflexion       Ankle plantarflexion       Ankle inversion       Ankle eversion        (Blank rows = not tested)   GAIT: Distance walked: In the clinic Assistive device utilized: None Level of assistance: Complete Independence Comments: Slow with B lateral lean     TODAY'S TREATMENT  [facility] Adult PT Treatment:                                                DATE: 02/03/2022 Therapeutic Exercise: NuStep L6 x 5 min with UE/LE while taking subjective Seated physioball rollout 10 x 5 sec LTR with legs on physioball 10 x 5 sec each Alternating leg lift with 90-90 hold 2 x 5 with 10 sec  hold Bridge 2 x 10 - bolster under legs due to right knee pain Sidelying hip abduction 2 x 10 each Sit to stand without UE support 2 x 10 Deadlift 25# from 6" box 2 x 10   [facility] Adult PT Treatment:                                                DATE: 01/27/2022 Therapeutic Exercise: NuStep L5 x 5 min with UE/LE while taking subjective Seated physioball rollout 10 x 5 sec LTR 10 x 5 sec each Bridge 2 x 10 - bolster under legs due to right knee pain SLR with focus on abdominal activation 2 x 10 each Sidelying hip abduction 2 x 10 each Sit to stand without UE support 2 x 10  [facility] Adult PT Treatment:                                                DATE: 01/14/2022 Therapeutic Exercise: NuStep L5 x 5 min with UE/LE while taking subjective Seated hamstring stretch 2 x 20 sec each Seated physioball rollout 10 x 5 sec LTR 10 x 5 sec each Hooklying SKTC stretch using towel 5 x 10 sec each Bridge 2 x 10 SLR with focus on abdominal activation 2 x 10 each Hooklying clamshell with blue 2 x 10 Sit to stand without UE support 2 x 10   PATIENT EDUCATION:  Education details: HEP Person educated: Patient Education method: Consulting civil engineer, Demonstration, Corporate treasurer cues, Verbal cues Education comprehension: verbalized understanding, returned demonstration, verbal cues required, tactile cues required, and needs further education   HOME EXERCISE PROGRAM: Access Code: [email or web address]    ASSESSMENT: CLINICAL IMPRESSION: Patient tolerated therapy well with no adverse effects. She does demonstrate improved his strength this visit and was able to tolerate progressions in lifting and core strengthening exercises with report of increased low back pain. She does note persistent right knee and leg  pain that does not seem radicular or related to lower back. Therapy focuses primarily on progressing her core and hip strengthening and control. She did require cueing for proper lifting mechanics and able to perform correctly  following cues. No changes to HEP this visit. Patient would benefit from continue skilled PT to progress her mobility and strength in order to reduce pain and maximize functional ability.     OBJECTIVE IMPAIRMENTS Abnormal gait, decreased activity tolerance, decreased endurance, decreased knowledge of condition, decreased mobility, difficulty walking, decreased ROM, decreased strength, decreased safety awareness, impaired perceived functional ability, impaired flexibility, improper body mechanics, postural dysfunction, obesity, and pain.    ACTIVITY LIMITATIONS carrying, lifting, bending, sitting, standing, squatting, sleeping, stairs, bed mobility, and locomotion level   PARTICIPATION LIMITATIONS: meal prep, cleaning, community activity, and occupation   PERSONAL FACTORS HLD, vericose veins, obesity, anxiety are also affecting patient's functional outcome.      GOALS: Goals reviewed with patient? Yes   SHORT TERM GOALS: Target date: 01/23/2022   Vasiliki will improve hip flexion to at least 90 degrees Baseline: 70-75 degrees Goal status: INITIAL   2.  Improve lumbar extension to > 10 degrees Baseline: 10 degrees Goal status: INITIAL   3.  Michela will be independent in her day 1 HEP  Baseline: Started 12/26/2021 Goal status: INITIAL   LONG TERM GOALS: Target date: 02/20/2022   Improve FOTO to 50 Baseline: 31 Goal status: INITIAL   2.  Improve low back pain to 0-4/10 with sciatica at 0-2/10 on the NPRS Baseline: 4-10/10 Goal status: INITIAL   3.  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  Milissa will be independent with her long-term HEP at [place] Baseline: Started 12/26/2021 Goal status: INITIAL   PLAN: PT FREQUENCY: 1-2x/week   PT DURATION: 8 weeks   PLANNED INTERVENTIONS: Therapeutic exercises, Therapeutic activity, Neuromuscular  re-education, Gait training, Patient/Family education, Self Care, Dry Needling, Cryotherapy, Traction, and Manual therapy.   PLAN FOR NEXT SESSION: Review HEP and progress PRN,  Practical posture and body mechanics work for her job at [employer].  Progress low back strength in an effort to decrease pain and reduce sciatica.    Hilda Blades, PT, DPT, LAT, ATC 02/03/22  9:07 AM Phone: [phone] Fax: [phone]    PHYSICAL THERAPY DISCHARGE SUMMARY  Visits from Start of Care: 5  Current functional level related to goals / functional outcomes: See above   Remaining deficits: See above   Education / Equipment: HEP   Patient agrees to discharge. Patient goals were not met. Patient is being discharged due to not returning since the last visit.  Hilda Blades, PT, DPT, LAT, ATC 02/23/22  2:14 PM Phone: [phone] Fax: [phone]

## 2022-02-03 ENCOUNTER — Ambulatory Visit: Payer: Self-pay | Admitting: Physical Therapy

## 2022-02-03 ENCOUNTER — Other Ambulatory Visit: Payer: Self-pay

## 2022-02-03 ENCOUNTER — Encounter: Payer: Self-pay | Admitting: Physical Therapy

## 2022-02-03 DIAGNOSIS — R293 Abnormal posture: Secondary | ICD-10-CM

## 2022-02-03 DIAGNOSIS — M6281 Muscle weakness (generalized): Secondary | ICD-10-CM

## 2022-02-03 DIAGNOSIS — M5459 Other low back pain: Secondary | ICD-10-CM

## 2022-02-03 DIAGNOSIS — M5416 Radiculopathy, lumbar region: Secondary | ICD-10-CM

## 2022-02-10 ENCOUNTER — Ambulatory Visit: Payer: Self-pay | Admitting: Physical Therapy

## 2022-02-11 ENCOUNTER — Ambulatory Visit: Payer: Self-pay | Admitting: Orthopaedic Surgery

## 2022-02-19 ENCOUNTER — Ambulatory Visit: Payer: Self-pay | Admitting: Physical Therapy

## 2022-05-11 ENCOUNTER — Encounter: Payer: Self-pay | Admitting: Nurse Practitioner

## 2022-05-11 DIAGNOSIS — Z1231 Encounter for screening mammogram for malignant neoplasm of breast: Secondary | ICD-10-CM

## 2022-06-04 ENCOUNTER — Encounter: Payer: Self-pay | Admitting: General Practice

## 2022-06-04 DIAGNOSIS — Z1231 Encounter for screening mammogram for malignant neoplasm of breast: Secondary | ICD-10-CM

## 2022-06-22 ENCOUNTER — Encounter (HOSPITAL_COMMUNITY): Payer: Self-pay

## 2022-06-22 ENCOUNTER — Ambulatory Visit (HOSPITAL_COMMUNITY)
Admission: EM | Admit: 2022-06-22 | Discharge: 2022-06-22 | Disposition: A | Discharge status: Home / Self Care | Payer: Self-pay | Attending: Physician Assistant | Admitting: Physician Assistant

## 2022-06-22 DIAGNOSIS — M5410 Radiculopathy, site unspecified: Secondary | ICD-10-CM

## 2022-06-22 MED ORDER — PREDNISONE 5 MG (21) PO TBPK: ORAL_TABLET | ORAL | 0 refills | Status: DC

## 2022-06-22 NOTE — ED Provider Notes (Signed)
[facility]    CSN: [id number] Arrival date & time: 06/22/22  1503      History   Chief Complaint Chief Complaint  Patient presents with   Leg Pain   Dental Pain    HPI Mary Heath is a 52 y.o. female.   Complains of right leg pain.  She reports the pain is mostly to the front of the lower extremity.  This pain started about 1 year ago.  She reports numbness and tingling to the leg.  She reports she has been told in the past that this pain is from her back.  She has not followed with orthopedic in a while.  She denies loss of bowel or bladder control.  She is ambulating without difficulty.  She has been taking ibuprofen and Excedrin with minimal pain relief.  She also complains of pain to her whole mouth has multiple dental caries.  She reports she has been unable to see a dentist due to insurance.  She describes diffuse pain no particular tooth is painful at this time.  No drainage, swelling, redness.    Past Medical History:  Diagnosis Date   Depression    HSV-2 infection    Hyperlipidemia    Umbilical hernia    Varicose veins of both lower extremities     Patient Active Problem List   Diagnosis Date Noted   Major depressive disorder, recurrent episode, severe ([place]) 04/21/2021   Generalized anxiety disorder 04/21/2021   Panic disorder 04/21/2021   BACK PAIN 01/14/2010   HSV 06/20/2009   HYPERCHOLESTEROLEMIA 06/19/2009   VARICOSE VEINS, LOWER EXTREMITIES 06/19/2009   LEG PAIN, LEFT 06/06/2009   TRICHOMONAL VAGINITIS 12/28/2007   ONYCHOMYCOSIS 12/05/2007   OBESITY 71/10/2692   UMBILICAL HERNIA 85/46/2703    Past Surgical History:  Procedure Laterality Date   TUBAL LIGATION      OB History     Gravida  5   Para  5   Term  5   Preterm      AB      Living  5      SAB      IAB      Ectopic      Multiple      Live Births               Home Medications    Prior to Admission medications   Medication Sig Start Date End  Date Taking? Authorizing Provider  FLUoxetine (PROZAC) 40 MG capsule Take 1 capsule (40 mg total) by mouth daily. 07/22/21 08/21/21  Patrecia Pour, NP  gabapentin (NEURONTIN) 300 MG capsule Take 1 capsule by mouth 3 (three) times daily. 07/18/20   [provider]  ibuprofen (ADVIL) 800 MG tablet Take 1 tablet (800 mg total) by mouth every 8 (eight) hours as needed for up to 21 doses for fever, headache, mild pain or moderate pain. 07/26/21   Lynden Oxford Scales, PA-C  lidocaine (LIDODERM) 5 % Place 1 patch onto the skin daily. Remove & Discard patch within 12 hours or as directed by MD 09/21/20   Randal Buba, April, MD  metFORMIN (GLUCOPHAGE-XR) 500 MG 24 hr tablet SMARTSIG:1 Tablet(s) By Mouth Every Evening 07/09/20   [provider]  methocarbamol (ROBAXIN) 500 MG tablet Take 1 tablet (500 mg total) by mouth 2 (two) times daily as needed for muscle spasms. 12/11/21   Aundra Dubin, PA-C  mirtazapine (REMERON SOL-TAB) 15 MG disintegrating tablet Take 0.5 tablets (7.5 mg total)  by mouth at bedtime. 07/22/21 08/21/21  Patrecia Pour, NP  predniSONE (STERAPRED UNI-PAK 21 TAB) 5 MG (21) TBPK tablet Take as directed 06/22/22   Ward, Lenise Arena, PA-C  omeprazole (PRILOSEC) 20 MG capsule Take 1 capsule (20 mg total) by mouth daily. Patient not taking: Reported on 05/17/2018 09/11/17 04/25/20  Charlesetta Shanks, MD    Family History Family History  Problem Relation Age of Onset   Breast cancer Mother     Social History Social History   Tobacco Use   Smoking status: Never   Smokeless tobacco: Never  Vaping Use   Vaping Use: Never used  Substance Use Topics   Alcohol use: No   Drug use: No     Allergies   Patient has no known allergies.   Review of Systems Review of Systems  Constitutional:  Negative for chills and fever.  HENT:  Positive for dental problem. Negative for ear pain and sore throat.   Eyes:  Negative for pain and visual disturbance.  Respiratory:  Negative for  cough and shortness of breath.   Cardiovascular:  Negative for chest pain and palpitations.  Gastrointestinal:  Negative for abdominal pain and vomiting.  Genitourinary:  Negative for dysuria and hematuria.  Musculoskeletal:  Positive for arthralgias. Negative for back pain.  Skin:  Negative for color change and rash.  Neurological:  Negative for seizures and syncope.  All other systems reviewed and are negative.    Physical Exam Triage Vital Signs ED Triage Vitals [06/22/22 1714]  Enc Vitals Group     BP 125/85     Pulse Rate 89     Resp 18     Temp 97.8 F (36.6 C)     Temp Source Oral     SpO2 95 %     Weight      Height      Head Circumference      Peak Flow      Pain Score      Pain Loc      Pain Edu?      Excl. in [place]?    No data found.  Updated Vital Signs BP 125/85 (BP Location: Left Arm)   Pulse 89   Temp 97.8 F (36.6 C) (Oral)   Resp 18   SpO2 95%   Visual Acuity Right Eye Distance:   Left Eye Distance:   Bilateral Distance:    Right Eye Near:   Left Eye Near:    Bilateral Near:     Physical Exam Vitals and nursing note reviewed.  Constitutional:      General: She is not in acute distress.    Appearance: She is well-developed.  HENT:     Head: Normocephalic and atraumatic.     Mouth/Throat:     Dentition: Abnormal dentition. Dental caries present. No dental abscesses.  Eyes:     Conjunctiva/sclera: Conjunctivae normal.  Cardiovascular:     Rate and Rhythm: Normal rate and regular rhythm.     Heart sounds: No murmur heard. Pulmonary:     Effort: Pulmonary effort is normal. No respiratory distress.     Breath sounds: Normal breath sounds.  Abdominal:     Palpations: Abdomen is soft.     Tenderness: There is no abdominal tenderness.  Musculoskeletal:        General: No swelling.     Cervical back: Neck supple.     Comments: Right positive right leg raise.  Normal range of motion.  Strength.  Skin:    General: Skin is warm and dry.      Capillary Refill: Capillary refill takes less than 2 seconds.  Neurological:     Mental Status: She is alert.  Psychiatric:        Mood and Affect: Mood normal.      UC Treatments / Results  Labs (all labs ordered are listed, but only abnormal results are displayed) Labs Reviewed - No data to display  EKG   Radiology No results found.  Procedures Procedures (including critical care time)  Medications Ordered in UC Medications - No data to display  Initial Impression / Assessment and Plan / UC Course  I have reviewed the triage vital signs and the nursing notes.  Pertinent labs & imaging results that were available during my care of the patient were reviewed by me and considered in my medical decision making (see chart for details).     Leg pain seems radicular in her description of numbness and tingling.  Also reports she was previously told that this pain is coming from her back.  Will start prednisone Dosepak.  Described supportive care.  Advise follow-up with orthopedic if no improvement. Patient with diffuse mouth pain, multiple dental caries and broken teeth.  No dental abscess noted at this time.  Advise follow-up with dentist ASAP resources given. Final Clinical Impressions(s) / UC Diagnoses   Final diagnoses:  Radicular pain of lower extremity     Discharge Instructions      Take prednisone as prescribed Avoid NSAIDS like Ibuprofen while taking prednisone.  Can take Tylenol as needed.  If no improvement follow up with Primary Care Physician or Orthopedics.  Recommend follow up with a dentist.      ED Prescriptions     Medication Sig Dispense Auth. Provider   predniSONE (STERAPRED UNI-PAK 21 TAB) 5 MG (21) TBPK tablet Take as directed 21 tablet Ward, Tylene Fantasia, PA-C      PDMP not reviewed this encounter.   Ward, Tylene Fantasia, PA-C 06/22/22 1731

## 2022-06-22 NOTE — Discharge Instructions (Addendum)
Take prednisone as prescribed Avoid NSAIDS like Ibuprofen while taking prednisone.  Can take Tylenol as needed.  If no improvement follow up with Primary Care Physician or Orthopedics.  Recommend follow up with a dentist.

## 2022-06-22 NOTE — ED Triage Notes (Signed)
Patient c/o right knee and right shin pain x 1 year.   Patient states she has been taking Ibuprofen and Excedrin. Patient states she last took 2 days ago.   Patient added that she has dental pain in her "whole mouth"

## 2022-12-19 ENCOUNTER — Other Ambulatory Visit: Payer: Self-pay | Admitting: Physician Assistant

## 2023-01-09 ENCOUNTER — Ambulatory Visit (HOSPITAL_COMMUNITY)
Admission: EM | Admit: 2023-01-09 | Discharge: 2023-01-09 | Disposition: A | Discharge status: Home / Self Care | Payer: 59 | Attending: Internal Medicine | Admitting: Internal Medicine

## 2023-01-09 ENCOUNTER — Encounter (HOSPITAL_COMMUNITY): Payer: Self-pay | Admitting: Emergency Medicine

## 2023-01-09 DIAGNOSIS — H5700 Unspecified anomaly of pupillary function: Secondary | ICD-10-CM

## 2023-01-09 DIAGNOSIS — H1132 Conjunctival hemorrhage, left eye: Secondary | ICD-10-CM

## 2023-01-09 MED ORDER — CARBOXYMETHYLCELLULOSE SODIUM 1 % OP SOLN: 1.0000 [drp] | Freq: Three times a day (TID) | OPHTHALMIC | 12 refills | Status: DC

## 2023-01-09 NOTE — ED Provider Notes (Signed)
[facility]    CSN: [id number] Arrival date & time: 01/09/23  1334      History   Chief Complaint Chief Complaint  Patient presents with   Eye Problem    HPI Mary Heath is a 52 y.o. female.   Patient presents to clinic over concerns of a spot of redness in her left eye in the conjunctival area. She denies any vision changes, pain or eye drainage. Denies coughing, rubbing her eye or any FB.   Reports a history of prediabetes.  Reports her vision is blurry at her baseline. Does not have an ophthalmologist.   Has not tried any interventions at home.     The history is provided by the patient and medical records.  Eye Problem Associated symptoms: redness   Associated symptoms: no discharge, no itching and no photophobia     Past Medical History:  Diagnosis Date   Depression    HSV-2 infection    Hyperlipidemia    Umbilical hernia    Varicose veins of both lower extremities     Patient Active Problem List   Diagnosis Date Noted   Major depressive disorder, recurrent episode, severe (HCC) 04/21/2021   Generalized anxiety disorder 04/21/2021   Panic disorder 04/21/2021   BACK PAIN 01/14/2010   HSV 06/20/2009   HYPERCHOLESTEROLEMIA 06/19/2009   VARICOSE VEINS, LOWER EXTREMITIES 06/19/2009   LEG PAIN, LEFT 06/06/2009   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 Authorizing Provider  carboxymethylcellulose 1 % ophthalmic solution Apply 1 drop to eye 3 (three) times daily. 01/09/23  Yes Rinaldo Ratel, [place] N, FNP  FLUoxetine  (PROZAC) 40 MG capsule Take 1 capsule (40 mg total) by mouth daily. 07/22/21 08/21/21  Charm Rings, NP  gabapentin (NEURONTIN) 300 MG capsule Take 1 capsule by mouth 3 (three) times daily. 07/18/20   [provider]  ibuprofen (ADVIL) 800 MG tablet Take 1 tablet (800 mg total) by mouth every 8 (eight) hours as needed for up to 21 doses for fever, headache, mild pain or moderate pain. 07/26/21   Theadora Rama Scales, PA-C  lidocaine (LIDODERM) 5 % Place 1 patch onto the skin daily. Remove & Discard patch within 12 hours or as directed by MD 09/21/20   Nicanor Alcon, April, MD  metFORMIN (GLUCOPHAGE-XR) 500 MG 24 hr tablet SMARTSIG:1 Tablet(s) By Mouth Every Evening 07/09/20   [provider]  methocarbamol (ROBAXIN) 500 MG tablet Take 1 tablet (500 mg total) by mouth 2 (two) times daily as needed for muscle spasms. 12/11/21   Cristie Hem, PA-C  mirtazapine (REMERON SOL-TAB) 15 MG disintegrating tablet Take 0.5 tablets (7.5 mg total) by mouth at bedtime. 07/22/21 08/21/21  Charm Rings, NP  predniSONE (STERAPRED UNI-PAK 21 TAB) 5 MG (21)  TBPK tablet Take as directed 06/22/22   Ward, Tylene Fantasia, PA-C  omeprazole (PRILOSEC) 20 MG capsule Take 1 capsule (20 mg total) by mouth daily. Patient not taking: Reported on 05/17/2018 09/11/17 04/25/20  Arby Barrette, MD    Family History Family History  Problem Relation Age of Onset   Breast cancer Mother     Social History Social History   Tobacco Use   Smoking status: Never   Smokeless tobacco: Never  Vaping Use   Vaping status: Never Used  Substance Use Topics   Alcohol use: No   Drug use: No     Allergies   Patient has no known allergies.   Review of Systems Review of Systems  Eyes:  Positive for redness. Negative for photophobia, pain, discharge, itching and visual disturbance.     Physical Exam Triage Vital Signs ED Triage Vitals [01/09/23 1445]  Encounter Vitals Group     BP (!) 143/87     Systolic BP Percentile       Diastolic BP Percentile      Pulse Rate 72     Resp 16     Temp 97.8 F (36.6 C)     Temp Source Oral     SpO2 97 %     Weight      Height      Head Circumference      Peak Flow      Pain Score 6     Pain Loc      Pain Education      Exclude from Growth Chart    No data found.  Updated Vital Signs BP (!) 143/87 (BP Location: Right Arm)   Pulse 72   Temp 97.8 F (36.6 C) (Oral)   Resp 16   SpO2 97%   Visual Acuity Right Eye Distance:  (Denies being able to see anything) Left Eye Distance: 20/50 Bilateral Distance: 20/50  Right Eye Near:   Left Eye Near:    Bilateral Near:     Physical Exam Vitals and nursing note reviewed.  Constitutional:      Appearance: Normal appearance.  HENT:     Head: Normocephalic and atraumatic.     Right Ear: External ear normal.     Left Ear: External ear normal.     Nose: Nose normal.     Mouth/Throat:     Mouth: Mucous membranes are moist.  Eyes:     General: Lids are normal. Lids are everted, no foreign bodies appreciated.        Right eye: No discharge.        Left eye: No discharge.     Pupils:     Right eye: Pupil is not reactive and sluggish.      Comments: Right pupil w/o constriction to direct light, sluggish constriction with direct light to left eye.   Musculoskeletal:     Cervical back: Normal range of motion.  Neurological:     Mental Status: She is alert.  Psychiatric:        Behavior: Behavior is cooperative.      UC Treatments / Results  Labs (all labs ordered are listed, but only abnormal results are displayed) Labs Reviewed - No data to display  EKG   Radiology No results found.  Procedures Procedures (including critical care time)  Medications Ordered in UC Medications - No data to display  Initial Impression / Assessment and Plan / UC Course  I have reviewed the triage vital signs and  the nursing notes.  Pertinent labs & imaging results that were available during my care of the  patient were reviewed by me and considered in my medical decision making (see chart for details).  Vitals and triage reviewed, patient is hemodynamically stable.  Appears to have a subconjunctival hemorrhage to her left eye, provided with lubricating drops.  Denies any pain or drainage.  Vision grossly intact in left eye.  Unable to complete right eye vision screening, reports she was unable to see the Snellen chart.  Upon further examination the pupil of her right eye is non-responsive to direct light, does constrict when light is shone in left eye.  Patient denies any recent changes in vision, this appears to be a chronic issue.  Offered further evaluation at the emergency department, but since this does not appear to be new or emergent, given information for ophthalmologist.  Strict emergency and ophthalmology precautions given, patient verbalized understanding.  No questions at this time.     Final Clinical Impressions(s) / UC Diagnoses   Final diagnoses:  Subconjunctival hematoma, left  Pupil reaction absent     Discharge Instructions      Please refrain from itching or scratching at your eye.  The red spot on your eye will gradually heal.  I am more concerned about your right eye and the pupil not responding.  Since this does not appear to be a new issue, please contact the ophthalmologist as soon as they open on Monday for evaluation. If anything worsens please go straight to the nearest ED.      ED Prescriptions     Medication Sig Dispense Auth. Provider   carboxymethylcellulose 1 % ophthalmic solution Apply 1 drop to eye 3 (three) times daily. 30 mL Tanika Bracco, [place] N, FNP      PDMP not reviewed this encounter.   Shanasia Ibrahim, [place] N, [place] 01/09/23 [phone]

## 2023-01-09 NOTE — ED Triage Notes (Signed)
Reports new redness in left eye just adjacent to medial iris. Denies any trauma or foreign body, denies hx of same. Reports chronic bilateral blurred vision, but believes it may be worsening in effected eye. Reports mild pain and irritated sensation, denies drainage. Denies any use of OTC meds/eyedrops or other home remedies for treatment

## 2023-01-09 NOTE — Discharge Instructions (Addendum)
Please refrain from itching or scratching at your eye.  The red spot on your eye will gradually heal.  I am more concerned about your right eye and the pupil not responding.  Since this does not appear to be a new issue, please contact the ophthalmologist as soon as they open on Monday for evaluation. If anything worsens please go straight to the nearest ED.

## 2023-01-16 LAB — AMB RESULTS CONSOLE CBG: Glucose: 105

## 2023-01-26 ENCOUNTER — Encounter: Payer: Self-pay | Admitting: *Deleted

## 2023-01-26 NOTE — Progress Notes (Signed)
Pt attended 01/16/23 screening event where her b/p was 114/70 and her blood sugar was 105. At the event, the pt did not note a PCP name and did identify SDOH food, housing, transportation and utility insecurities for which she was given resources as the event. Chart review indicates pt saw Roselyn Reef, FNP, at [facility] location on 06/04/22. During f/u call, pt states she does have PCP at [facility] but wants to change drs. She states she does not have insurance "that works", so this health equity team member shared info about [facility] primary care options, expanded Medicaid eligibility, Affordable Care Act insurance, and potential [facility] financial assist for pt to review and follow up. Letter sent to pt with all the insurance info, and [employer] and [employer] and additional SDOH resources for food, housing, utilities and transportation.

## 2023-02-03 DIAGNOSIS — Z841 Family history of disorders of kidney and ureter: Secondary | ICD-10-CM | POA: Diagnosis not present

## 2023-02-03 DIAGNOSIS — G4700 Insomnia, unspecified: Secondary | ICD-10-CM | POA: Diagnosis not present

## 2023-02-03 DIAGNOSIS — F324 Major depressive disorder, single episode, in partial remission: Secondary | ICD-10-CM | POA: Diagnosis not present

## 2023-02-03 DIAGNOSIS — I10 Essential (primary) hypertension: Secondary | ICD-10-CM | POA: Diagnosis not present

## 2023-02-03 DIAGNOSIS — Z823 Family history of stroke: Secondary | ICD-10-CM | POA: Diagnosis not present

## 2023-02-03 DIAGNOSIS — R600 Localized edema: Secondary | ICD-10-CM | POA: Diagnosis not present

## 2023-02-03 DIAGNOSIS — Z7984 Long term (current) use of oral hypoglycemic drugs: Secondary | ICD-10-CM | POA: Diagnosis not present

## 2023-02-03 DIAGNOSIS — M1990 Unspecified osteoarthritis, unspecified site: Secondary | ICD-10-CM | POA: Diagnosis not present

## 2023-02-03 DIAGNOSIS — H538 Other visual disturbances: Secondary | ICD-10-CM | POA: Diagnosis not present

## 2023-02-03 DIAGNOSIS — K5900 Constipation, unspecified: Secondary | ICD-10-CM | POA: Diagnosis not present

## 2023-02-03 DIAGNOSIS — E119 Type 2 diabetes mellitus without complications: Secondary | ICD-10-CM | POA: Diagnosis not present

## 2023-02-10 ENCOUNTER — Other Ambulatory Visit: Payer: Self-pay | Admitting: Family

## 2023-02-10 DIAGNOSIS — Z0000 Encounter for general adult medical examination without abnormal findings: Secondary | ICD-10-CM | POA: Diagnosis not present

## 2023-02-10 DIAGNOSIS — Z1239 Encounter for other screening for malignant neoplasm of breast: Secondary | ICD-10-CM | POA: Diagnosis not present

## 2023-02-10 DIAGNOSIS — Z1159 Encounter for screening for other viral diseases: Secondary | ICD-10-CM | POA: Diagnosis not present

## 2023-02-10 DIAGNOSIS — Z1329 Encounter for screening for other suspected endocrine disorder: Secondary | ICD-10-CM | POA: Diagnosis not present

## 2023-02-10 DIAGNOSIS — F332 Major depressive disorder, recurrent severe without psychotic features: Secondary | ICD-10-CM | POA: Diagnosis not present

## 2023-02-10 DIAGNOSIS — Z113 Encounter for screening for infections with a predominantly sexual mode of transmission: Secondary | ICD-10-CM | POA: Diagnosis not present

## 2023-02-10 DIAGNOSIS — Z131 Encounter for screening for diabetes mellitus: Secondary | ICD-10-CM | POA: Diagnosis not present

## 2023-02-10 DIAGNOSIS — R7303 Prediabetes: Secondary | ICD-10-CM | POA: Diagnosis not present

## 2023-02-10 DIAGNOSIS — Z1231 Encounter for screening mammogram for malignant neoplasm of breast: Secondary | ICD-10-CM

## 2023-02-10 DIAGNOSIS — H538 Other visual disturbances: Secondary | ICD-10-CM | POA: Diagnosis not present

## 2023-02-10 DIAGNOSIS — Z1389 Encounter for screening for other disorder: Secondary | ICD-10-CM | POA: Diagnosis not present

## 2023-02-10 DIAGNOSIS — Z1321 Encounter for screening for nutritional disorder: Secondary | ICD-10-CM | POA: Diagnosis not present

## 2023-02-10 DIAGNOSIS — Z1211 Encounter for screening for malignant neoplasm of colon: Secondary | ICD-10-CM | POA: Diagnosis not present

## 2023-02-10 DIAGNOSIS — Z114 Encounter for screening for human immunodeficiency virus [HIV]: Secondary | ICD-10-CM | POA: Diagnosis not present

## 2023-02-10 DIAGNOSIS — Z23 Encounter for immunization: Secondary | ICD-10-CM | POA: Diagnosis not present

## 2023-02-10 DIAGNOSIS — Z13220 Encounter for screening for lipoid disorders: Secondary | ICD-10-CM | POA: Diagnosis not present

## 2023-02-11 ENCOUNTER — Ambulatory Visit: Payer: 59

## 2023-03-04 ENCOUNTER — Other Ambulatory Visit: Payer: Self-pay

## 2023-03-04 ENCOUNTER — Emergency Department (HOSPITAL_COMMUNITY)
Admission: EM | Admit: 2023-03-04 | Discharge: 2023-03-04 | Disposition: A | Discharge status: Home / Self Care | Payer: 59 | Attending: Emergency Medicine | Admitting: Emergency Medicine

## 2023-03-04 ENCOUNTER — Emergency Department (HOSPITAL_COMMUNITY): Payer: 59

## 2023-03-04 ENCOUNTER — Encounter (HOSPITAL_COMMUNITY): Payer: Self-pay | Admitting: Emergency Medicine

## 2023-03-04 DIAGNOSIS — R079 Chest pain, unspecified: Secondary | ICD-10-CM | POA: Diagnosis not present

## 2023-03-04 DIAGNOSIS — R0789 Other chest pain: Secondary | ICD-10-CM | POA: Diagnosis not present

## 2023-03-04 DIAGNOSIS — K219 Gastro-esophageal reflux disease without esophagitis: Secondary | ICD-10-CM | POA: Insufficient documentation

## 2023-03-04 LAB — COMPREHENSIVE METABOLIC PANEL
ALT: 23 U/L (ref 0–44)
AST: 19 U/L (ref 15–41)
Albumin: 3.3 g/dL — ABNORMAL LOW (ref 3.5–5.0)
Alkaline Phosphatase: 70 U/L (ref 38–126)
Anion gap: 10 (ref 5–15)
BUN: 17 mg/dL (ref 6–20)
CO2: 23 mmol/L (ref 22–32)
Calcium: 9.4 mg/dL (ref 8.9–10.3)
Chloride: 106 mmol/L (ref 98–111)
Creatinine, Ser: 0.67 mg/dL (ref 0.44–1.00)
GFR, Estimated: >60 mL/min (ref >60)
Glucose, Bld: 101 mg/dL — ABNORMAL HIGH (ref 70–99)
Potassium: 3.5 mmol/L (ref 3.5–5.1)
Sodium: 139 mmol/L (ref 135–145)
Total Bilirubin: 0.6 mg/dL (ref 0.3–1.2)
Total Protein: 7.1 g/dL (ref 6.5–8.1)

## 2023-03-04 LAB — CBC WITH DIFFERENTIAL/PLATELET
Abs Immature Granulocytes: 0.01 10*3/uL (ref 0.00–0.07)
Basophils Absolute: 0.1 10*3/uL (ref 0.0–0.1)
Basophils Relative: 1 %
Eosinophils Absolute: 0.5 10*3/uL (ref 0.0–0.5)
Eosinophils Relative: 8 %
HCT: 35.5 % — ABNORMAL LOW (ref 36.0–46.0)
Hemoglobin: 11.6 g/dL — ABNORMAL LOW (ref 12.0–15.0)
Immature Granulocytes: 0 %
Lymphocytes Relative: 47 %
Lymphs Abs: 3 10*3/uL (ref 0.7–4.0)
MCH: 29.4 pg (ref 26.0–34.0)
MCHC: 32.7 g/dL (ref 30.0–36.0)
MCV: 90.1 fL (ref 80.0–100.0)
Monocytes Absolute: 0.5 10*3/uL (ref 0.1–1.0)
Monocytes Relative: 8 %
Neutro Abs: 2.3 10*3/uL (ref 1.7–7.7)
Neutrophils Relative %: 36 %
Platelets: 258 10*3/uL (ref 150–400)
RBC: 3.94 MIL/uL (ref 3.87–5.11)
RDW: 14.6 % (ref 11.5–15.5)
WBC: 6.4 10*3/uL (ref 4.0–10.5)
nRBC: 0 % (ref 0.0–0.2)

## 2023-03-04 LAB — TROPONIN I (HIGH SENSITIVITY)
Troponin I (High Sensitivity): 4 ng/L (ref <18)
Troponin I (High Sensitivity): 4 ng/L (ref <18)

## 2023-03-04 LAB — LIPASE, BLOOD: Lipase: 28 U/L (ref 11–51)

## 2023-03-04 MED ORDER — ALUM & MAG HYDROXIDE-SIMETH 200-200-20 MG/5ML PO SUSP
30.0000 mL | Freq: Once | ORAL | Status: AC
  Administered 2023-03-04: 30 mL via ORAL
  Filled 2023-03-04: qty 30

## 2023-03-04 MED ORDER — PANTOPRAZOLE SODIUM 20 MG PO TBEC
40.0000 mg | DELAYED_RELEASE_TABLET | Freq: Every day | ORAL | 0 refills | Status: AC
Start: 2023-03-04 — End: ?

## 2023-03-04 MED ORDER — LIDOCAINE VISCOUS HCL 2 % MT SOLN
15.0000 mL | Freq: Once | OROMUCOSAL | Status: AC
  Administered 2023-03-04: 15 mL via ORAL
  Filled 2023-03-04: qty 15

## 2023-03-04 NOTE — ED Provider Notes (Signed)
[facility] EMERGENCY DEPARTMENT AT [facility] Provider Note   CSN: [id number] Arrival date & time: 03/04/23  6962     History  Chief Complaint  Patient presents with   Chest Pain    Mary Heath is a 52 y.o. female.  52 year old female presents today for concern of chest pain and epigastric abdominal pain.  No associated nausea and vomiting.  Endorses some shortness of breath.  Symptoms started around midnight last night.  Denies any history of CAD.  No associated diaphoresis, lightheadedness, palpitations.  Denies pleuritic component.  No alleviating or aggravating factors.  Reports pain is constant.  No prior history of DVT or PE.  The history is provided by the patient. No language interpreter was used.       Home Medications Prior to Admission medications   Medication Sig Start Date End Date Taking? Authorizing Provider  carboxymethylcellulose 1 % ophthalmic solution Apply 1 drop to eye 3 (three) times daily. 01/09/23   Garrison, [place] N, FNP  FLUoxetine (PROZAC) 40 MG capsule Take 1 capsule (40 mg total) by mouth daily. 07/22/21 08/21/21  Charm Rings, NP  gabapentin (NEURONTIN) 300 MG capsule Take 1 capsule by mouth 3 (three) times daily. 07/18/20   [provider]  ibuprofen (ADVIL) 800 MG tablet Take 1 tablet (800 mg total) by mouth every 8 (eight) hours as needed for up to 21 doses for fever, headache, mild pain or moderate pain. 07/26/21   Theadora Rama Scales, PA-C  lidocaine (LIDODERM) 5 % Place 1 patch onto the skin daily. Remove & Discard patch within 12 hours or as directed by MD 09/21/20   Nicanor Alcon, April, MD  metFORMIN (GLUCOPHAGE-XR) 500 MG 24 hr tablet SMARTSIG:1 Tablet(s) By Mouth Every Evening 07/09/20   [provider]  methocarbamol (ROBAXIN) 500 MG tablet Take 1 tablet (500 mg total) by mouth 2 (two) times daily as needed for muscle spasms. 12/11/21   Cristie Hem, PA-C  mirtazapine (REMERON SOL-TAB) 15 MG disintegrating  tablet Take 0.5 tablets (7.5 mg total) by mouth at bedtime. 07/22/21 08/21/21  Charm Rings, NP  predniSONE (STERAPRED UNI-PAK 21 TAB) 5 MG (21) TBPK tablet Take as directed 06/22/22   Ward, Tylene Fantasia, PA-C  omeprazole (PRILOSEC) 20 MG capsule Take 1 capsule (20 mg total) by mouth daily. Patient not taking: Reported on 05/17/2018 09/11/17 04/25/20  Arby Barrette, MD      Allergies    Patient has no known allergies.    Review of Systems   Review of Systems  Constitutional:  Negative for chills and fever.  Respiratory:  Positive for shortness of breath. Negative for cough.   Cardiovascular:  Positive for chest pain.  Gastrointestinal:  Positive for abdominal pain. Negative for nausea and vomiting.  Neurological:  Negative for light-headedness.  All other systems reviewed and are negative.   Physical Exam Updated Vital Signs BP 131/85   Pulse 75   Temp 97.7 F (36.5 C) (Oral)   Resp 16   SpO2 99%  Physical Exam Vitals and nursing note reviewed.  Constitutional:      General: She is not in acute distress.    Appearance: Normal appearance. She is not ill-appearing.  HENT:     Head: Normocephalic and atraumatic.     Nose: Nose normal.  Eyes:     Conjunctiva/sclera: Conjunctivae normal.  Cardiovascular:     Rate and Rhythm: Normal rate and regular rhythm.  Pulmonary:     Effort: Pulmonary effort is  normal. No respiratory distress.  Abdominal:     General: There is no distension.     Palpations: Abdomen is soft.     Tenderness: There is no abdominal tenderness. There is no guarding.  Musculoskeletal:        General: No deformity. Normal range of motion.     Cervical back: Normal range of motion.  Skin:    Findings: No rash.  Neurological:     Mental Status: She is alert.     ED Results / Procedures / Treatments   Labs (all labs ordered are listed, but only abnormal results are displayed) Labs Reviewed  COMPREHENSIVE METABOLIC PANEL - Abnormal; Notable for the  following components:      Result Value   Glucose, Bld 101 (*)    Albumin 3.3 (*)    All other components within normal limits  CBC WITH DIFFERENTIAL/PLATELET - Abnormal; Notable for the following components:   Hemoglobin 11.6 (*)    HCT 35.5 (*)    All other components within normal limits  TROPONIN I (HIGH SENSITIVITY)  TROPONIN I (HIGH SENSITIVITY)    EKG None  Radiology DG Chest 2 View  Result Date: 03/04/2023 CLINICAL DATA:  Chest pain. EXAM: CHEST - 2 VIEW COMPARISON:  10/08/2019. FINDINGS: The heart size and mediastinal contours are within normal limits. Both lungs are clear. Degenerative changes are present in the thoracic spine. No acute osseous abnormality. IMPRESSION: No active cardiopulmonary disease. Electronically Signed   By: Thornell Sartorius M.D.   On: 03/04/2023 03:31    Procedures Procedures    Medications Ordered in ED Medications - No data to display  ED Course/ Medical Decision Making/ A&P                                 Medical Decision Making Amount and/or Complexity of Data Reviewed Labs: ordered. Radiology: ordered.  Risk OTC drugs. Prescription drug management.   52 year old female presents today for concern of chest pain and epigastric abdominal pain.  No prior history of CAD.  No associated anginal symptoms.  Overall well-appearing.  Hemodynamically stable.  Low heart score.  CBC without leukocytosis.  Hemoglobin around patient's baseline at 11.6.  CMP with glucose of 101 otherwise without acute finding.  Troponin negative x 2.  GI cocktail given.  Significant improvement in symptoms.  Lipase was collected but patient did not want to wait prior to discharge for this.  Will start patient on Protonix and have her follow-up with PCP.  Reticulocyte component to her discomfort, and she is without tachypnea, tachycardia, or hypoxia.  Low suspicion for PE.  Patient discharged in stable condition.  Return precaution discussed.  Admission considered  however patient's symptoms improved and she is hemodynamically stable.   Final Clinical Impression(s) / ED Diagnoses Final diagnoses:  Atypical chest pain  Gastroesophageal reflux disease, unspecified whether esophagitis present    Rx / DC Orders ED Discharge Orders          Ordered    pantoprazole (PROTONIX) 20 MG tablet  Daily        03/04/23 1156              Marita Kansas, PA-C 03/04/23 1205    Alvira Monday, MD 03/04/23 2218

## 2023-03-04 NOTE — Discharge Instructions (Addendum)
You likely have acid reflux.  Your symptoms improved with GI cocktail in the emergency department.  Your blood work was otherwise reassuring.  Follow-up with your primary care provider.  For any concerning symptoms return to the emergency room.

## 2023-03-04 NOTE — ED Triage Notes (Signed)
PT reports centralized chest pain for an hour. Has not taken anything OTC for it and some mild SOB associated with it. Poor historian.

## 2023-03-23 ENCOUNTER — Encounter: Payer: Self-pay | Admitting: *Deleted

## 2023-03-23 NOTE — Progress Notes (Signed)
Pt attended 01/16/23 screening exam where her b/p was 114/70 and her blood sugar was 105. Pt identified SDOH insecurities of food and utilities and was given resources at the event, and she did not note a PCP name. During the initial event f/u, chart review indicated pt had established care with Mary Reef, FNP at [facility] in [place], 2024 but during phone call at that time, pt was not sure if she wanted to change PCP and additional PCP info was mailed to her. During this 60 day event f/u, chart review indicates pt saw PCP Mary Heath again on 02/10/23 for a complete annual physical at which time her PCP reviewed her SDOH needs and made referrals for mmg and GI health maintenance exams. Chart review also reveals that pt has future PCP visit with FNP Mary Heath on 03/25/23. No additional health equity team support indicated at this time.

## 2023-03-25 ENCOUNTER — Ambulatory Visit
Admission: RE | Admit: 2023-03-25 | Discharge: 2023-03-25 | Disposition: A | Discharge status: Home / Self Care | Payer: 59 | Source: Ambulatory Visit | Attending: Family | Admitting: Family

## 2023-03-25 DIAGNOSIS — Z124 Encounter for screening for malignant neoplasm of cervix: Secondary | ICD-10-CM | POA: Diagnosis not present

## 2023-03-25 DIAGNOSIS — K219 Gastro-esophageal reflux disease without esophagitis: Secondary | ICD-10-CM | POA: Diagnosis not present

## 2023-03-25 DIAGNOSIS — Z1231 Encounter for screening mammogram for malignant neoplasm of breast: Secondary | ICD-10-CM | POA: Diagnosis not present

## 2023-03-25 DIAGNOSIS — Z113 Encounter for screening for infections with a predominantly sexual mode of transmission: Secondary | ICD-10-CM | POA: Diagnosis not present

## 2023-07-19 ENCOUNTER — Other Ambulatory Visit: Payer: Self-pay

## 2023-07-19 ENCOUNTER — Emergency Department (HOSPITAL_COMMUNITY)
Admission: EM | Admit: 2023-07-19 | Discharge: 2023-07-20 | Discharge status: Against Medical Advice | Payer: 59 | Attending: Emergency Medicine | Admitting: Emergency Medicine

## 2023-07-19 ENCOUNTER — Emergency Department (HOSPITAL_COMMUNITY): Payer: 59

## 2023-07-19 DIAGNOSIS — Z5321 Procedure and treatment not carried out due to patient leaving prior to being seen by health care provider: Secondary | ICD-10-CM | POA: Diagnosis not present

## 2023-07-19 DIAGNOSIS — G8929 Other chronic pain: Secondary | ICD-10-CM | POA: Insufficient documentation

## 2023-07-19 DIAGNOSIS — M79661 Pain in right lower leg: Secondary | ICD-10-CM | POA: Diagnosis not present

## 2023-07-19 DIAGNOSIS — M79662 Pain in left lower leg: Secondary | ICD-10-CM | POA: Diagnosis not present

## 2023-07-19 DIAGNOSIS — R079 Chest pain, unspecified: Secondary | ICD-10-CM | POA: Diagnosis present

## 2023-07-19 DIAGNOSIS — Y92410 Unspecified street and highway as the place of occurrence of the external cause: Secondary | ICD-10-CM | POA: Insufficient documentation
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
## 2023-07-20 NOTE — ED Notes (Signed)
 Pt stated she was leaving to go somewhere else

## 2023-10-20 LAB — AMB RESULTS CONSOLE CBG: Glucose: 105

## 2023-10-20 NOTE — Progress Notes (Signed)
 Patient came to mobile screening at [facility]. Declined SDOH

## 2023-12-31 ENCOUNTER — Ambulatory Visit: Admitting: Family Medicine

## 2024-01-26 ENCOUNTER — Ambulatory Visit (HOSPITAL_COMMUNITY): Admission: EM | Admit: 2024-01-26 | Discharge: 2024-01-26 | Disposition: A | Discharge status: Home / Self Care

## 2024-01-26 DIAGNOSIS — Z62898 Other specified problems related to upbringing: Secondary | ICD-10-CM | POA: Insufficient documentation

## 2024-01-26 DIAGNOSIS — M5430 Sciatica, unspecified side: Secondary | ICD-10-CM | POA: Insufficient documentation

## 2024-01-26 DIAGNOSIS — E119 Type 2 diabetes mellitus without complications: Secondary | ICD-10-CM | POA: Insufficient documentation

## 2024-01-26 DIAGNOSIS — Z5971 Insufficient health insurance coverage: Secondary | ICD-10-CM | POA: Insufficient documentation

## 2024-01-26 DIAGNOSIS — K0889 Other specified disorders of teeth and supporting structures: Secondary | ICD-10-CM | POA: Insufficient documentation

## 2024-01-26 DIAGNOSIS — K219 Gastro-esophageal reflux disease without esophagitis: Secondary | ICD-10-CM | POA: Insufficient documentation

## 2024-01-26 DIAGNOSIS — F332 Major depressive disorder, recurrent severe without psychotic features: Secondary | ICD-10-CM | POA: Insufficient documentation

## 2024-01-26 DIAGNOSIS — F419 Anxiety disorder, unspecified: Secondary | ICD-10-CM | POA: Insufficient documentation
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
## 2024-01-26 NOTE — Progress Notes (Signed)
 The patient attended a screening event on 10/19/2023, where her blood pressure was measured at 124/82 mmHg, and her blood glucose was 103 mg/dl. During the event, the patient reported that she does not smoke, has insurance, is established with a primary care provider ([facility]?), and declined the SDOH screener.  A chart review confirmed that the patient has Annabella Rigg, NP - [facility] as her listed PCP and she has [employer] and Medicaid for her insurance.    The patients most recent office visit was on 03/25/2023 with her PCP, the pt also had another office appt with another provider, Dr. Ronal Snow at [facility] on 06/28/2023. The pt has an encounter for 01/26/2024 with her PCP. The pt does not have any [facility] visible upcoming appts with her PCP at this time.   Call Attempt #1: CHW called patient's PCP office to obtain appointment status. The office shared that the pt was seen for her sick visit back in February 2025, the pt's last annual visit with her PCP was February 10, 2023 and she does not have any upcoming appts at this time. CHW shared screening results with the representative and encouraged follow up. The representative stated that she will inform the PCP and care team to contact the patient for follow up.  At this time, no additional support from the Health Equity Team is indicated.

## 2024-01-26 NOTE — ED Provider Notes (Signed)
 Behavioral Health Urgent Care Medical Screening Exam  Patient Name: Mary Heath MRN: [id number] Date of Evaluation: 01/26/24 Chief Complaint:  I might be alright if I take a pill. Diagnosis:  Final diagnoses:  Severe episode of recurrent major depressive disorder, without psychotic features (HCC)    History of Present illness: Mary Heath is a 53 y.o. female. Patient reports she has no medical insurance because her job is with [employer] and it is not steady. She works for 3 months and then has 3 months off for [employer]. Her medicaid lapsed and she has several health problems that need to be addressed. She is seeking help for this and is feeling depressed about this circumstance. Her sleep is poor. She is having tooth pain, an eye problem and has GERD, sciatic nerve pain and DM that is going untreated. Nobody is going to see me. I make too much money for disability.  Pt endorses symptoms of depression. She is tearful. She has anhedonia, crying and hopelessness, fatigue and worthlessness. Her sleep is terrible 1-2 hrs.  Regarding SI she reports that she thought about it but she would not do it because she is scared. I am trying to hold on for my kids. She is not able to pay her bills and her mortgage. She has to work 3 weeks before she gets any money. She is hoping for a pill to help with the anxiety and depression she feels. She got treatment in this building before but not recently.   She reports her mother died when she was 49-12 years old and her father's parents housed her after that but did not care for her. They kept me for the money.   Flowsheet Row ED from 01/26/2024 in [facility] ED from 07/19/2023 in [facility] Emergency Department at [facility] ED from 03/04/2023 in [facility] Emergency Department at [facility]  C-SSRS RISK CATEGORY No Risk No Risk No Risk    Psychiatric Specialty Exam  Presentation  General  Appearance:Fairly Groomed  Eye Contact:Fair  Speech:Slow  Speech Volume:Decreased  Handedness:-- (not elicited)   Mood and Affect  Mood:Depressed; Dysphoric  Affect:Congruent; Tearful   Thought Process  Thought Processes:Goal Directed  Descriptions of Associations:Intact  Orientation:Full (Time, Place and Person)  Thought Content:Logical  Diagnosis of Schizophrenia or Schizoaffective disorder in past: No data recorded Duration of Psychotic Symptoms: No data recorded Hallucinations:None  Ideas of Reference:None  Suicidal Thoughts:Yes, Passive Without Intent; Without Plan; Without Means to Carry Out  Homicidal Thoughts:No   Sensorium  Memory:Immediate Fair; Immediate Good; Recent Good; Remote Good  Judgment:Good  Insight:Fair; Good   Executive Functions  Concentration:Good  Attention Span:Good  Recall:Good  Fund of Knowledge:Good  Language:Good   Psychomotor Activity  Psychomotor Activity:Psychomotor Retardation; Other (comment) (sommolent)   Assets  Assets:Housing; Social Support; Communication Skills   Sleep  Sleep:Poor  Number of hours: 2   Physical Exam: Physical Exam Vitals reviewed.  Constitutional:      Appearance: She is obese.  HENT:     Head: Normocephalic and atraumatic.     Right Ear: External ear normal.     Nose: Nose normal.  Eyes:     Conjunctiva/sclera: Conjunctivae normal.  Pulmonary:     Effort: Pulmonary effort is normal.  Musculoskeletal:        General: Normal range of motion.     Cervical back: Normal range of motion.  Skin:    General: Skin is dry.  Neurological:  Gait: Gait normal.  Psychiatric:        Judgment: Judgment normal.    Review of Systems  Constitutional:  Positive for malaise/fatigue. Negative for chills, fever and weight loss.  HENT:         Toothache  Eyes:  Positive for blurred vision and photophobia.       I can hardly see  Gastrointestinal:  Positive for heartburn.   Genitourinary:  Negative for dysuria.  Musculoskeletal:  Positive for back pain and joint pain.  Skin:  Negative for rash.  Neurological:  Positive for headaches.  Psychiatric/Behavioral:  Positive for depression. Negative for hallucinations, substance abuse and suicidal ideas. The patient is nervous/anxious and has insomnia.    Blood pressure 135/79, pulse 89, temperature 98.7 F (37.1 C), temperature source Oral, resp. rate 20, SpO2 99%. There is no height or weight on file to calculate BMI.  Musculoskeletal: Strength & Muscle Tone: within normal limits Gait & Station: broad based Patient leans: N/A   [facility] MSE Discharge Disposition for Follow up and Recommendations: Based on my evaluation the patient does not appear to have an emergency medical condition and can be discharged with resources and follow up care in outpatient services for Medication Management and Individual Therapy.  Gave patient resources for dental and health care without insurance.  Patient was agreeable to follow up with resources.   Garvin JINNY Gaines, MD 01/26/2024, 2:48 PM

## 2024-01-26 NOTE — Discharge Instructions (Addendum)
 Arrive at [facility] clinic arrive at 6:45 am first come first serve.

## 2024-01-26 NOTE — Progress Notes (Signed)
   01/26/24 0941  [facility] Triage Screening (Walk-ins at [facility] only)  How Did You Hear About Us ? Self  What Is the Reason for Your Visit/Call Today? Mary Heath is a 53 year old female presenting to [facility] unaccompanied. Pt states she cant get any help and she is depressed. Pt is unsure if she wants to hurt herself. She states, I dont care what happens to be anymore. Pt is stating she is needing depression medication to help her feel better. Pt has established services in IOP and was instructed to come down here for care. Pt appears to be depressed and feels like she has no help with her disability and medicaid. Pt denies substance use, Hi and AVH.Pt is tearful, depressed mood, neat appearance, speech is normal, eye contact is normal, motor activity is normal and affect is full.  How Long Has This Been Causing You Problems? > than 6 months  Have You Recently Had Any Thoughts About Hurting Yourself? Yes  Are You Planning to Commit Suicide/Harm Yourself At This time? No  Have you Recently Had Thoughts About Hurting Someone Sherral? No  Are You Planning To Harm Someone At This Time? No  Physical Abuse Denies  Verbal Abuse Denies  Sexual Abuse Denies  Exploitation of patient/patient's resources Denies  Self-Neglect Denies  Possible abuse reported to: Other (Comment)  Are you currently experiencing any auditory, visual or other hallucinations? No  Have You Used Any Alcohol or Drugs in the Past 24 Hours? No  Do you have any current medical co-morbidities that require immediate attention? No  Clinician description of patient physical appearance/behavior: Pt is tearful, depressed mood, neat appearance, speech is normal, eye contact is normal, motor activity is normal and affect is full.  What Do You Feel Would Help You the Most Today? Medication(s)  If access to [facility] was not available, would you have sought care in the Emergency Department? No  Determination of Need Urgent (48 hours)  Options For  Referral Medication Management;Intensive Outpatient Therapy;Inpatient Hospitalization  Determination of Need filed? Yes

## 2024-03-02 NOTE — Progress Notes (Signed)
 Influenza Vaccination Record and Consent   1 Are you allergic to latex products? No  2.  Have you had a severe or life threatening allergic reaction to the flu vaccine in the past? No  3. Have you had Guillain-Barre Syndrome within the 6 weeks of receiving the flu vaccine ? No  4. Do you have a fever now? No   Vaccine screening questions were asked. Answers and informed consent were received from selfSUBJECTIVE: Patient presents for vaccine services.  OBJECTIVE: Vaccinated to prevent communicable disease. Vaccine history reviewed.  ASSESSMENT: Vaccine screening completed.  PLAN: Contraindications reviewed and VIS form/s given today. Consent reviewed and signed by patient. Vaccination record updated. Patient instructed on side effects related to vaccine/s and to seek emergency service should any adverse reactions occur.   Administered:  Immunizations Administered on Date of Encounter - 03/02/2024  Reviewed on 03/02/2024    Name Date   Influenza (FLUBLOK),recombinant,injectable,preservative Free 03/02/2024       Patient tolerated vaccination well.  Patient verbalized understanding of vaccine care and instructions.  to administer the following vaccines:  Orders Placed This Encounter   FLUBLOK (19 YRS PLUS)   A1C, ALERE AFINION (POCT) Routine   GLUCOSE BLOOD (POCT) Routine

## 2024-03-02 NOTE — Progress Notes (Signed)
 Subjective   Mary Heath is a 53 year old female here for Hypertension Mary Heath is here for hypertension follow-up. Mary Heath takes medication as prescribed. Mary Heath complains of constipation and would like a refill of the Miralax.//// )  Mary Heath was last seen in this office on 06/28/23 by Dr. Cherisse. At that time, Mary Heath was seen for an acute visit of vaginal d/c and left ear pain.  Mary Heath is here today alone for chronic disease of preDM f/u. Regarding her elevated BP today, Mary Heath does not have any history of HTN or any use of antihypertensives Mary Heath does have a history of depression and endorses some recent struggles with her mental health as it is the anniversary of her son's passing.  Mary Heath was seen in the behavioral UC last month x and continues on Prozac  daily.  Mary Heath continues to decline therapy and endorses being supported by her friends and other children during this time. Mary Heath denies SI/HI or hallucinations.   Regarding her preDM, Mary Heath is currently taking Metformin 500mg  XR daily and denies any abdominal pain, N/V or diarrhea. Mary Heath is requesting refills.    Diet: 2 meals/day; min. Snacks; eats fried foods often  Water intake: 2-3 cups/day Soda/Juice: 1 cup x every 2 weeks  Salt Intake: eating more fried foods and  Exercise: occasional walking; but does not walk often Denies chest pain, SOB, palpitations, dizziness, H/As or slurred speech No recent ED/UC visits reported Last A1C: 6.0% (02/10/23)     Documentation Reviewed by Any User: Tobacco  Allergies  Meds  Med Hx   Surg Hx  Fam Hx  Soc Hx    Current Outpatient Medications  Medication Sig Dispense Refill   pantoprazole  (PROTONIX ) 40 mg EC tablet TAKE 1 TABLET BY MOUTH ONCE DAILY IN THE MORNING BEFORE BREAKFAST 90 Tablet 0   metFORMIN XR (GLUCOPHAGE-XR) 500 mg 24 hr tablet Take 2 Tablets by mouth once daily with dinner 180 Tablet 1   cyclobenzaprine (FLEXERIL) 10 mg tablet Take 1 Tablet by mouth 3 (three) times daily as needed for muscle spasms 60  Tablet 0   meloxicam  (MOBIC ) 15 mg tablet Take 1 Tablet by mouth daily 30 Tablet 0   FLUoxetine  (PROZAC ) 40 mg capsule Take 1 Capsule by mouth once daily 90 Capsule 3   ibuprofen  800 mg tablet Take 1 Tablet by mouth 3 (three) times daily with meals 90 Tablet 1   salicylic acid 17 % external solution Apply topically once daily Soak wart in warm water for 5 minutes. Dry area thoroughly. Apply 1 drop to cover wart. Let dry. Repeat once or twice daily until wart is removed for up to 12 weeks. 50 mL 1   polyethylene glycol, PEG, 3350 (MIRALAX) 17 gram packet take 17 gram mixed with 8 oz. water, juice, soda, coffee or tea by oral route once daily     traZODone  (DESYREL ) 150 mg tablet Take 1 Tablet by mouth nightly at bedtime     Review of Systems  Constitutional:  Negative for appetite change, chills, diaphoresis and fatigue.  Eyes:  Negative for visual disturbance.  Respiratory:  Negative for shortness of breath.   Cardiovascular:  Negative for chest pain and palpitations.  Gastrointestinal:  Negative for abdominal pain, diarrhea, nausea and vomiting.  Endocrine: Negative for polydipsia, polyphagia and polyuria.  Skin:  Negative for rash and wound.  Neurological:  Negative for dizziness, light-headedness and headaches.  Psychiatric/Behavioral:  Negative for hallucinations, self-injury and suicidal ideas. The patient is nervous/anxious.    Objective  BP (!) 152/80 (Left Arm, Sitting, Large Adult)  Pulse 72  Temp 96.5 F (35.8 C)  Ht 5' 3 (1.6 m)  Wt 251 lb (113.9 kg)  LMP  (LMP Unknown)  SpO2 98%  BMI 44.46 kg/m  OB Status Postmenopausal  Smoking Status Never  BSA 2.25 m  GLUCOSE, SERUM (mg/dL)  Date Value Status  90/81/7975 92 Final   BLOOD GLUCOSE (mg/dL)  Date Value Status  89/90/7974 92 Final   HEMOGLOBIN A1C  Date Value Status  02/10/2023 6.0 %Hb (H) Final  12/03/2021 6.2 % (H) Final   HGB A1C (%)  Date Value Status  03/02/2024 5.7 (A) Final     Physical  Exam Vitals and nursing note reviewed.   Constitutional:      General: Mary Heath is not in acute distress.    Appearance: Normal appearance. Mary Heath is not ill-appearing, toxic-appearing or diaphoretic.   Cardiovascular:     Rate and Rhythm: Normal rate and regular rhythm.     Heart sounds: Normal heart sounds.  Pulmonary:     Effort: Pulmonary effort is normal.     Breath sounds: Normal breath sounds.  Neurological:     Mental Status: Mary Heath is alert and oriented to person, place, and time. Mental status is at baseline.  Psychiatric:        Mood and Affect: Mood is depressed. Affect is tearful.        Behavior: Behavior normal. Behavior is cooperative.        Thought Content: Thought content normal.        Cognition and Memory: Cognition normal.        Judgment: Judgment normal.    Assessment and Plan   1. Prediabetes (Primary) -A1C has decreased from 6.0% to 5.7%;congratulated on improvement in her A1C. -Instructed to continue Metformin as prescribed and counseled on diet/lifestyle changes. -Will continue to monitor and adjust regimen as needed. - A1C, ALERE AFINION (POCT) Routine - GLUCOSE BLOOD (POCT) Routine  2. Need for vaccination - FLUBLOK (19 YRS PLUS)  3. Severe episode of recurrent major depressive disorder, without psychotic features -Noted to become tearful when discussing her son's death and expresses increased sadness.  -Prozac  may likely need to be adjusted or combined with another medication for management.   - FLUoxetine  (PROZAC ) 40 mg capsule; Take 1 Capsule by mouth once daily.  Dispense: 90 Capsule; Refill: 3 - traZODone  (DESYREL ) 150 mg tablet; Take 1 Tablet by mouth nightly at bedtime.  Dispense: 90 Tablet; Refill: 1  4. Elevated blood-pressure reading without diagnosis of hypertension -Noted to be elevated on initial and recheck; is likely r/t dx. #3; will continue to offer resources and assistance as needed.  -Will continue to monitor BP closely to  determine if HTN is underlying.   5. Constipation, unspecified constipation type -Mentioned towards the end of this visit; rx. Sent at patient request. - polyethylene glycol, PEG, 3350 (MIRALAX) 17 gram packet; Take 17 g by mouth once daily take 17 gram mixed with 8 oz. water, juice, soda, coffee or tea by oral route once daily.  Dispense: 30 Each; Refill: 3  Regulatory Documentation: The following were addressed in today's visit: Lifestyle Measures: BMI follow up plan for 18+: The patient was counseled regarding nutrition and physical activity  Return in about 1 month (around 04/02/2024) for in 1-2 months for CPE w/ labs.   Mary Rigg, NP Family Nurse Practitioner [facility]

## 2024-06-07 ENCOUNTER — Ambulatory Visit: Payer: Self-pay

## 2024-06-07 VITALS — BP 144/90 | HR 82 | Ht 62.0 in | Wt 256.0 lb

## 2024-06-07 DIAGNOSIS — M5441 Lumbago with sciatica, right side: Secondary | ICD-10-CM | POA: Diagnosis not present

## 2024-06-07 DIAGNOSIS — G4700 Insomnia, unspecified: Secondary | ICD-10-CM | POA: Diagnosis not present

## 2024-06-07 DIAGNOSIS — G8929 Other chronic pain: Secondary | ICD-10-CM

## 2024-06-07 DIAGNOSIS — E7800 Pure hypercholesterolemia, unspecified: Secondary | ICD-10-CM | POA: Diagnosis not present

## 2024-06-07 DIAGNOSIS — M5442 Lumbago with sciatica, left side: Secondary | ICD-10-CM

## 2024-06-07 DIAGNOSIS — F332 Major depressive disorder, recurrent severe without psychotic features: Secondary | ICD-10-CM

## 2024-06-07 DIAGNOSIS — R7303 Prediabetes: Secondary | ICD-10-CM | POA: Diagnosis not present

## 2024-06-07 DIAGNOSIS — Z8669 Personal history of other diseases of the nervous system and sense organs: Secondary | ICD-10-CM

## 2024-06-07 LAB — POCT GLYCOSYLATED HEMOGLOBIN (HGB A1C): HbA1c, POC (prediabetic range): 6.1 % (ref 5.7–6.4)

## 2024-06-07 MED ORDER — GABAPENTIN 300 MG PO CAPS: 300.0000 mg | ORAL_CAPSULE | Freq: Three times a day (TID) | ORAL | 1 refills | Status: AC

## 2024-06-07 MED ORDER — TRAZODONE HCL 150 MG PO TABS: 150.0000 mg | ORAL_TABLET | Freq: Every day | ORAL | 1 refills | Status: AC

## 2024-06-07 NOTE — Patient Instructions (Addendum)
 It was great to meet you today!  Please take your trazodone  every night. This will help it have it's maximum effect on your sleep and depression.   We will do labs for you today to evaluate your pre diabetes, cholesterol, kidney and liver function, and electrolytes.   We will refer you for evaluation by an ophthalmologist for your retinal detachment.   Please follow up in 2 weeks!  Please schedule your follow up visit at the front desk before you leave today.   Thank you for choosing [facility].   Please call [phone] with any questions about today's appointment.  Leafy Scriver, DO Family Medicine \  If you are feeling suicidal or depression symptoms worsen please immediately go to:   If you are thinking about harming yourself or having thoughts of suicide, or if you know someone who is, seek help right away. If you are in crisis, make sure you are not left alone.  If someone else is in crisis, make sure he/she/they is not left alone  Call 988 OR 1-800-273-TALK  24 Hour Availability for Walk-IN services  [facility]  [address] [place], [place] [phone] Crisis [phone]    Other crisis resources:  [facility] [employer] (Domestic Violence, Rape & Victim Assistance [phone]  [facility] [employer] [facility]    (ONLY from 8am-4pm)    [phone]  [facility] Mobile Crisis Unit (24/7)   [phone]  [place]  National Suicide Hotline   (919)219-8591 MERRILYN)

## 2024-06-07 NOTE — Progress Notes (Signed)
" ° ° °  SUBJECTIVE:   CHIEF COMPLAINT / HPI:   Patient presents to establish care. Previous primary care at [facility].   Medical history:  HLD, Insomnia, Prediabetes, GERD, constipation, chronic low back pain w/ radiculopathy Social history: Denies history of tobacco, alcohol or drug use. Has four adult children who she doesn't see often these days. Had a son w/ a disability who passed in 2009, has felt quite lonely since this happened, lives alone in a home where she is working on paying off the mortgage. 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   Patient reports sleeping about 4 hours per night, usually from 10pm-1am and maybe another hour before the morning. She takes her trazodone  inconsistently, says she's not sure how much it helps with her sleep.   Her medical care has been intermittent given inconsistent insurance coverage. Now has medicaid.   Patient reports chronically feeling lonely, depressed and like she has no purpose since the death of her son. Denies any desire or plan to harm herself. She feels it might be good for her to see a veterinary surgeon. States she takes her prozac  every day.   OBJECTIVE:   BP (!) 144/90   Pulse 82   Ht 5' 2 (1.575 m)   Wt 116.1 kg   SpO2 98%   BMI 46.82 kg/m   General: Awake, alert, NAD. Communicates clearly. Tearful during encounter.  Cardio: RRR. Normal S1, S2. No murmur, rub, gallop. 2+ radial pulses b/l w/ good capillary refill.  Resp: CTA bilaterally. No wheezes, rales, or rhonchi. Normal work of breathing on room air.  MSK:  Lumbar spine: - Inspection: no gross deformity or asymmetry, swelling or ecchymosis - Palpation: No TTP over the spinous processes. +TTP over R paraspinal muscles, TTP over L SI joint - ROM: full active ROM of the lumbar spine in flexion and extension without pain - Special testing: Negative straight leg raise, +Kemp test b/l Neuro: Bilateral UE and LE strength 5/5. No gross focal deficits.     ASSESSMENT/PLAN:   Assessment & Plan Severe episode of recurrent major depressive disorder, without psychotic features (HCC) PHQ9 elevated to 20, 1 reported on Q9 Patient reports chronically feeling this since passing of her son in 2009 Denies active SI, plan to harm herself or others, or access to weapons.  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  Sight limited to minimal light perception in R eye. Refer to ophthalmology for any further management of remote episode    Return to clinic in 2 weeks  Leafy Scriver, DO [facility] "

## 2024-06-08 DIAGNOSIS — R7303 Prediabetes: Secondary | ICD-10-CM | POA: Insufficient documentation

## 2024-06-08 DIAGNOSIS — G4700 Insomnia, unspecified: Secondary | ICD-10-CM | POA: Insufficient documentation

## 2024-06-08 LAB — COMPREHENSIVE METABOLIC PANEL WITH GFR
ALT: 14 IU/L (ref 0–32)
AST: 15 IU/L (ref 0–40)
Albumin: 4.1 g/dL (ref 3.8–4.9)
Alkaline Phosphatase: 79 IU/L (ref 49–135)
BUN/Creatinine Ratio: 20 (ref 9–23)
BUN: 13 mg/dL (ref 6–24)
Bilirubin Total: 0.5 mg/dL (ref 0.0–1.2)
CO2: 19 mmol/L — ABNORMAL LOW (ref 20–29)
Calcium: 9.5 mg/dL (ref 8.7–10.2)
Chloride: 106 mmol/L (ref 96–106)
Creatinine, Ser: 0.66 mg/dL (ref 0.57–1.00)
Globulin, Total: 3.3 g/dL (ref 1.5–4.5)
Glucose: 96 mg/dL (ref 70–99)
Potassium: 4.1 mmol/L (ref 3.5–5.2)
Sodium: 143 mmol/L (ref 134–144)
Total Protein: 7.4 g/dL (ref 6.0–8.5)
eGFR: 104 mL/min/1.73

## 2024-06-08 LAB — LIPID PANEL
Chol/HDL Ratio: 4.4 ratio (ref 0.0–4.4)
Cholesterol, Total: 208 mg/dL — ABNORMAL HIGH (ref 100–199)
HDL: 47 mg/dL
LDL Chol Calc (NIH): 139 mg/dL — ABNORMAL HIGH (ref 0–99)
Triglycerides: 122 mg/dL (ref 0–149)
VLDL Cholesterol Cal: 22 mg/dL (ref 5–40)

## 2024-06-08 NOTE — Assessment & Plan Note (Addendum)
 Last A1c 5.7 10/25 A1c 6.1 today Continue metformin 500mg  daily

## 2024-06-08 NOTE — Assessment & Plan Note (Addendum)
 Last LDL 145 9/24, not on statin Lipid panel today Start high intensity statin at f/u visit pending results

## 2024-06-08 NOTE — Assessment & Plan Note (Addendum)
 Chronic LBP w/ radiculopathy, seen previously by Dr Jerri at [facility] Continue gabapentin  300mg  TID Has benefited from PT in the past, consider re-referral now that patient has stable insurance Dedicated f/u appointment needed

## 2024-06-08 NOTE — Assessment & Plan Note (Addendum)
 PHQ9 elevated to 20, 1 reported on Q9 Patient reports chronically feeling this since passing of her son in 2009 Denies active SI, plan to harm herself or others, or access to weapons.  Continue prozac  40mg  daily Consider adjunctive treatment moving forward Dedicated f/u appointment needed

## 2024-06-08 NOTE — Assessment & Plan Note (Addendum)
 Sleeping < 4 hours per night, pattern described above Continue trazodone  150mg  qhs Long term issue, likely related at least in part to MDD Dedicated f/u appointment needed

## 2024-06-22 ENCOUNTER — Ambulatory Visit: Payer: Self-pay

## 2024-06-22 VITALS — BP 137/77 | HR 95 | Wt 250.6 lb

## 2024-06-22 DIAGNOSIS — G8929 Other chronic pain: Secondary | ICD-10-CM

## 2024-06-22 DIAGNOSIS — M5441 Lumbago with sciatica, right side: Secondary | ICD-10-CM | POA: Diagnosis not present

## 2024-06-22 DIAGNOSIS — M5442 Lumbago with sciatica, left side: Secondary | ICD-10-CM | POA: Diagnosis not present

## 2024-06-22 DIAGNOSIS — F332 Major depressive disorder, recurrent severe without psychotic features: Secondary | ICD-10-CM

## 2024-06-22 MED ORDER — FLUOXETINE HCL 40 MG PO CAPS: 40.0000 mg | ORAL_CAPSULE | Freq: Every day | ORAL | 2 refills | Status: AC

## 2024-06-22 MED ORDER — QUETIAPINE FUMARATE 25 MG PO TABS: 25.0000 mg | ORAL_TABLET | Freq: Every day | ORAL | 1 refills | Status: AC

## 2024-06-22 NOTE — Progress Notes (Signed)
" ° ° °  SUBJECTIVE:   CHIEF COMPLAINT / HPI:   Patient presents from further discussion of mood, sleep, back pain.   She reports her mood has been unchanged since last visit 2w ago. She has been taking her prozac  and trazodone  every day as instructed. Would like to get reestablished with a therapist now that her insurance is in place. She states that she feels like she'd be better off dead most days since the passing of her disabled son in 2009, but that she has no plan to harm herself or anyone else. Her sleep is unchanged, still getting about 4 hours of sleep per night with 1-2 interruptions. She endorses turning on the TV sometimes in the middle of the night if she can't fall back to sleep.   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 She's on her feet for the whole shift and her back pain and the pain that radiates into her legs b/l gets much worse by the end of the shift. She says she was offered a 15 minute break halfway through but it doesn't help much. She has a disability claim in process, is hopeful that she won't have to work in this job for much longer. She has been dealing with this pain at work for a couple of years at this point and it's becoming unsustainable for her.   PERTINENT  PMH / PSH: MDD, insomnia, chronic b/l low back pain w/ radiculopathy  OBJECTIVE:   Wt 250 lb 9.6 oz (113.7 kg)   BMI 45.84 kg/m   General: Awake, alert, NAD. Communicates clearly. Cardio: RRR. Normal S1, S2. No murmur, rub, gallop.  Resp: CTA bilaterally.  Psych: Depressed mood, appropriate affect. Denies active SI/HI.    ASSESSMENT/PLAN:   Assessment & Plan Severe episode of recurrent major depressive disorder, without psychotic features (HCC) PHQ 20 in clinic today, 3 on Q9.  Denies active SI, plan to harm herself or others, or access to weapons. Chronically positive Q9 since death of her son. Continue prozac  40mg  daily Continue trazodone  150mg  at  bedtime  Initiate seroquel  25mg  at bedtime  Medicaid therapy resources provided, patient states intention to reestablish with a therapist before next f/u appt  Chronic bilateral low back pain with bilateral sciatica Back pain and LE radiation limiting her ability to work a full shift.  Continue gabapentin  300mg  TID, can consider uptitrating in the future Work note provided, should work no more than 6 hour shifts Dedicated f/u needed   RTC in 2 weeks for further mood and back pain f/u  Leafy Scriver, DO [facility] "

## 2024-06-22 NOTE — Assessment & Plan Note (Addendum)
 PHQ 20 in clinic today, 3 on Q9.  Denies active SI, plan to harm herself or others, or access to weapons. Chronically positive Q9 since death of her son. Continue prozac  40mg  daily Continue trazodone  150mg  at bedtime  Initiate seroquel  25mg  at bedtime  Medicaid therapy resources provided, patient states intention to reestablish with a therapist before next f/u appt

## 2024-06-22 NOTE — Patient Instructions (Addendum)
 It was great to see you today!  We've started a new medication for your depression that should hopefully help you sleep better. It is called seroquel , or quetiapine . Please take it every night.   There is information for counseling offices that take your insurance. Please reach out to a few of them so get established with a new therapist. This in combination with your medications will give us  the best chance of improving your depression symptoms.   Please follow up in 2 weeks.   Thank you for choosing [facility].   Please call [phone] with any questions about today's appointment.  Leafy Scriver, DO Family Medicine    Therapy and Counseling Resources Most providers on this list will take Medicaid. Patients with commercial insurance or Medicare should contact their insurance company to get a list of in network providers.  [facility] (takes children) Location 1: [address] [place], [place] [address] Location 2: [address] [place], [place] [address] [phone]   [facility] (spanish speaking therapist available)(habla espanol)(take medicare and medicaid)  [address], [place], [place] [address], [place]  [email or web address] [phone]  [facility] [address] [place], [place] [address] [phone]  [facility]   [address], [address] [place], [place] [address]      [phone]  [facility] (spanish available) [address]  [place], [place] [address] [phone]  [facility] (take [facility] and medicare) [address]  [place], [place] [address]       [phone]     [facility] (virtual only) [phone]  [facility] [address] [place], [place] [phone]  [facility]:  [address] [place] [phone]  [facility]:  [address]  [place] [phone]  [facility] (under & uninsured) [address]   [place] [phone]    [email or web address]    [facility] [address]  [place]    [phone]  [facility] -[address]     Phone:  [phone]     [facility]-  [address]  [phone]   [facility] [address]      [place], [place] [phone]  [facility]: [address] [place], [place], [place]  [phone]   [facility] (Spanish therapist) [email or web address]  [address] [place]  [address]   [place] [address]    [phone]    [facility]   [address],  [place], [place]  [phone]   [facility]  [address] [place]  [phone]  [facility]  [address] [place], [place]        [phone]  Open Access/Walk In Clinic under & uninsured  [facility]  [address] [place], [place] [phone] Crisis [phone]  [facility] [address],  (Spanish)   [address] , [place]: [phone]) 8:30 - 12; 1 - 2:30  [facility] [employer],  [address] [place], [place]    [phone]):8:30 - 12; 2 - 3PM  [facility] [employer],  [address]  [place]; [phone]):   Mon - Fri 8 AM - 5 PM  [facility] [address] [place]  MWF 12:30 to 3:00 or call to schedule an appointment  [phone]  Specific Provider options Psychology Today  [email or web address]/us  click on find a therapist  enter your zip code left side and select or tailor a therapist for your specific need.   [facility] Provider Directory [email or web address]  (Medicaid)   Follow all drop down to find a provider  Social Support program [facility] [phone] or photosolver.pl [address], [place], [place] Recovery support and educational   24- Hour Availability:   [facility]  [address] [place], [place] [phone] Crisis [phone]  [facility] [phone]  [facility]  [phone]   [facility]  [phone] (after hours)  Therapeutic Alternative/Mobile Crisis   [phone]  [place]  National Suicide Hotline  854-357-0516 [place]ILY[place])  Call 911 or go to emergency room  [facility]  [phone]);  [place]  [phone]); [place], [place], [place], [place], [place], [place], [place]

## 2024-06-23 ENCOUNTER — Telehealth: Payer: Self-pay

## 2024-06-23 NOTE — Assessment & Plan Note (Signed)
 Back pain and LE radiation limiting her ability to work a full shift.  Continue gabapentin  300mg  TID, can consider uptitrating in the future Work note provided, should work no more than 6 hour shifts Dedicated f/u needed

## 2024-06-23 NOTE — Telephone Encounter (Signed)
 Patient calls nurse line regarding concerns with Quetiapine  prescription.   Medicaid is requiring that provider call plan and answer safety questions prior to filling prescription.   Number is [phone].  I have never heard of this specific authorization. Will also forward to [place].   Chiquita JAYSON English, RN

## 2024-07-06 ENCOUNTER — Ambulatory Visit

## 2024-07-07 ENCOUNTER — Ambulatory Visit: Payer: Self-pay
